# Patient Record
Sex: Female | Born: 1960 | Race: Black or African American | Hispanic: No | Marital: Single | Smoking: Never smoker
Health system: Southern US, Community
[De-identification: ages and names within clinical notes are randomized; demographics above are authoritative.]

## PROBLEM LIST (undated history)

## (undated) DIAGNOSIS — F419 Anxiety disorder, unspecified: Secondary | ICD-10-CM

## (undated) DIAGNOSIS — M1990 Unspecified osteoarthritis, unspecified site: Secondary | ICD-10-CM

## (undated) DIAGNOSIS — F329 Major depressive disorder, single episode, unspecified: Secondary | ICD-10-CM

## (undated) DIAGNOSIS — T148XXA Other injury of unspecified body region, initial encounter: Secondary | ICD-10-CM

## (undated) DIAGNOSIS — H269 Unspecified cataract: Secondary | ICD-10-CM

## (undated) DIAGNOSIS — D219 Benign neoplasm of connective and other soft tissue, unspecified: Secondary | ICD-10-CM

## (undated) DIAGNOSIS — M349 Systemic sclerosis, unspecified: Secondary | ICD-10-CM

## (undated) DIAGNOSIS — F32A Depression, unspecified: Secondary | ICD-10-CM

## (undated) DIAGNOSIS — G4730 Sleep apnea, unspecified: Secondary | ICD-10-CM

## (undated) DIAGNOSIS — G629 Polyneuropathy, unspecified: Secondary | ICD-10-CM

## (undated) DIAGNOSIS — M797 Fibromyalgia: Secondary | ICD-10-CM

## (undated) DIAGNOSIS — E7800 Pure hypercholesterolemia, unspecified: Secondary | ICD-10-CM

## (undated) DIAGNOSIS — I10 Essential (primary) hypertension: Secondary | ICD-10-CM

## (undated) HISTORY — DX: Systemic sclerosis, unspecified: M34.9

## (undated) HISTORY — DX: Sleep apnea, unspecified: G47.30

## (undated) HISTORY — DX: Other injury of unspecified body region, initial encounter: T14.8XXA

## (undated) HISTORY — DX: Benign neoplasm of connective and other soft tissue, unspecified: D21.9

## (undated) HISTORY — DX: Unspecified cataract: H26.9

## (undated) HISTORY — PX: EYE SURGERY: SHX253

## (undated) HISTORY — PX: MYOMECTOMY: SHX85

---

## 1995-10-10 HISTORY — PX: ABDOMINAL SURGERY: SHX537

## 1999-08-05 ENCOUNTER — Other Ambulatory Visit: Admission: RE | Admit: 1999-08-05 | Discharge: 1999-08-05 | Payer: Self-pay | Admitting: Obstetrics & Gynecology

## 2000-08-13 ENCOUNTER — Other Ambulatory Visit: Admission: RE | Admit: 2000-08-13 | Discharge: 2000-08-13 | Payer: Self-pay | Admitting: Obstetrics & Gynecology

## 2001-03-29 ENCOUNTER — Emergency Department (HOSPITAL_COMMUNITY): Admission: EM | Admit: 2001-03-29 | Discharge: 2001-03-29 | Payer: Self-pay | Admitting: Emergency Medicine

## 2001-05-06 ENCOUNTER — Encounter: Payer: Self-pay | Admitting: *Deleted

## 2001-05-06 ENCOUNTER — Ambulatory Visit (HOSPITAL_COMMUNITY): Admission: RE | Admit: 2001-05-06 | Discharge: 2001-05-06 | Payer: Self-pay | Admitting: *Deleted

## 2001-05-30 ENCOUNTER — Encounter: Payer: Self-pay | Admitting: *Deleted

## 2001-05-30 ENCOUNTER — Ambulatory Visit (HOSPITAL_COMMUNITY): Admission: RE | Admit: 2001-05-30 | Discharge: 2001-05-30 | Payer: Self-pay | Admitting: *Deleted

## 2001-09-04 ENCOUNTER — Other Ambulatory Visit: Admission: RE | Admit: 2001-09-04 | Discharge: 2001-09-04 | Payer: Self-pay | Admitting: Obstetrics & Gynecology

## 2002-01-20 ENCOUNTER — Encounter: Payer: Self-pay | Admitting: Emergency Medicine

## 2002-01-20 ENCOUNTER — Inpatient Hospital Stay (HOSPITAL_COMMUNITY): Admission: AC | Admit: 2002-01-20 | Discharge: 2002-01-29 | Payer: Self-pay

## 2002-01-21 ENCOUNTER — Encounter: Payer: Self-pay | Admitting: General Surgery

## 2002-01-23 ENCOUNTER — Encounter: Payer: Self-pay | Admitting: General Surgery

## 2002-05-13 ENCOUNTER — Encounter: Admission: RE | Admit: 2002-05-13 | Discharge: 2002-07-02 | Payer: Self-pay | Admitting: Internal Medicine

## 2003-06-22 ENCOUNTER — Emergency Department (HOSPITAL_COMMUNITY): Admission: EM | Admit: 2003-06-22 | Discharge: 2003-06-22 | Payer: Self-pay | Admitting: Emergency Medicine

## 2003-06-22 ENCOUNTER — Encounter: Payer: Self-pay | Admitting: Emergency Medicine

## 2004-03-02 ENCOUNTER — Ambulatory Visit (HOSPITAL_COMMUNITY): Admission: RE | Admit: 2004-03-02 | Discharge: 2004-03-02 | Payer: Self-pay | Admitting: Internal Medicine

## 2004-03-17 ENCOUNTER — Ambulatory Visit (HOSPITAL_COMMUNITY): Admission: RE | Admit: 2004-03-17 | Discharge: 2004-03-17 | Payer: Self-pay | Admitting: Internal Medicine

## 2004-08-02 ENCOUNTER — Ambulatory Visit (HOSPITAL_COMMUNITY): Admission: RE | Admit: 2004-08-02 | Discharge: 2004-08-02 | Payer: Self-pay

## 2004-08-05 ENCOUNTER — Ambulatory Visit (HOSPITAL_COMMUNITY): Admission: RE | Admit: 2004-08-05 | Discharge: 2004-08-05 | Payer: Self-pay

## 2005-05-08 ENCOUNTER — Ambulatory Visit (HOSPITAL_COMMUNITY): Admission: RE | Admit: 2005-05-08 | Discharge: 2005-05-08 | Payer: Self-pay | Admitting: Family Medicine

## 2005-05-08 ENCOUNTER — Emergency Department (HOSPITAL_COMMUNITY): Admission: EM | Admit: 2005-05-08 | Discharge: 2005-05-08 | Payer: Self-pay | Admitting: Family Medicine

## 2005-07-04 ENCOUNTER — Emergency Department (HOSPITAL_COMMUNITY): Admission: EM | Admit: 2005-07-04 | Discharge: 2005-07-04 | Payer: Self-pay | Admitting: Family Medicine

## 2005-12-16 ENCOUNTER — Emergency Department (HOSPITAL_COMMUNITY): Admission: EM | Admit: 2005-12-16 | Discharge: 2005-12-16 | Payer: Self-pay | Admitting: Family Medicine

## 2007-11-07 ENCOUNTER — Ambulatory Visit (HOSPITAL_COMMUNITY): Admission: RE | Admit: 2007-11-07 | Discharge: 2007-11-07 | Payer: Self-pay | Admitting: Internal Medicine

## 2007-11-13 ENCOUNTER — Ambulatory Visit (HOSPITAL_COMMUNITY): Admission: RE | Admit: 2007-11-13 | Discharge: 2007-11-13 | Payer: Self-pay | Admitting: Internal Medicine

## 2009-02-11 ENCOUNTER — Ambulatory Visit (HOSPITAL_COMMUNITY): Admission: RE | Admit: 2009-02-11 | Discharge: 2009-02-11 | Payer: Self-pay | Admitting: Internal Medicine

## 2012-05-06 ENCOUNTER — Other Ambulatory Visit (HOSPITAL_COMMUNITY): Payer: Self-pay | Admitting: Nurse Practitioner

## 2012-05-06 DIAGNOSIS — Z139 Encounter for screening, unspecified: Secondary | ICD-10-CM

## 2012-05-09 ENCOUNTER — Ambulatory Visit (HOSPITAL_COMMUNITY)
Admission: RE | Admit: 2012-05-09 | Discharge: 2012-05-09 | Disposition: A | Discharge status: Home / Self Care | Payer: Self-pay | Source: Ambulatory Visit | Attending: Family Medicine | Admitting: Family Medicine

## 2012-05-09 DIAGNOSIS — Z139 Encounter for screening, unspecified: Secondary | ICD-10-CM

## 2014-07-08 ENCOUNTER — Inpatient Hospital Stay (HOSPITAL_COMMUNITY)
Admission: EM | Admit: 2014-07-08 | Discharge: 2014-07-10 | DRG: 444 | Disposition: A | Discharge status: Home / Self Care | Payer: BC Managed Care – PPO | Attending: Family Medicine | Admitting: Family Medicine

## 2014-07-08 ENCOUNTER — Encounter (HOSPITAL_COMMUNITY): Payer: Self-pay | Admitting: Emergency Medicine

## 2014-07-08 ENCOUNTER — Emergency Department (HOSPITAL_COMMUNITY): Payer: BC Managed Care – PPO

## 2014-07-08 DIAGNOSIS — R1011 Right upper quadrant pain: Secondary | ICD-10-CM | POA: Diagnosis not present

## 2014-07-08 DIAGNOSIS — K810 Acute cholecystitis: Secondary | ICD-10-CM | POA: Diagnosis not present

## 2014-07-08 DIAGNOSIS — Z823 Family history of stroke: Secondary | ICD-10-CM

## 2014-07-08 DIAGNOSIS — E785 Hyperlipidemia, unspecified: Secondary | ICD-10-CM | POA: Diagnosis present

## 2014-07-08 DIAGNOSIS — G8929 Other chronic pain: Secondary | ICD-10-CM | POA: Diagnosis present

## 2014-07-08 DIAGNOSIS — Z800 Family history of malignant neoplasm of digestive organs: Secondary | ICD-10-CM

## 2014-07-08 DIAGNOSIS — I10 Essential (primary) hypertension: Secondary | ICD-10-CM

## 2014-07-08 DIAGNOSIS — F329 Major depressive disorder, single episode, unspecified: Secondary | ICD-10-CM

## 2014-07-08 DIAGNOSIS — K851 Biliary acute pancreatitis without necrosis or infection: Secondary | ICD-10-CM

## 2014-07-08 DIAGNOSIS — F32A Depression, unspecified: Secondary | ICD-10-CM

## 2014-07-08 DIAGNOSIS — M1990 Unspecified osteoarthritis, unspecified site: Secondary | ICD-10-CM | POA: Diagnosis present

## 2014-07-08 DIAGNOSIS — Z8249 Family history of ischemic heart disease and other diseases of the circulatory system: Secondary | ICD-10-CM

## 2014-07-08 DIAGNOSIS — M797 Fibromyalgia: Secondary | ICD-10-CM | POA: Diagnosis present

## 2014-07-08 DIAGNOSIS — M792 Neuralgia and neuritis, unspecified: Secondary | ICD-10-CM

## 2014-07-08 DIAGNOSIS — Z8271 Family history of polycystic kidney: Secondary | ICD-10-CM

## 2014-07-08 DIAGNOSIS — K5900 Constipation, unspecified: Secondary | ICD-10-CM | POA: Diagnosis present

## 2014-07-08 HISTORY — DX: Fibromyalgia: M79.7

## 2014-07-08 HISTORY — DX: Unspecified osteoarthritis, unspecified site: M19.90

## 2014-07-08 HISTORY — DX: Polyneuropathy, unspecified: G62.9

## 2014-07-08 HISTORY — DX: Essential (primary) hypertension: I10

## 2014-07-08 LAB — COMPREHENSIVE METABOLIC PANEL
ALT: 185 U/L — ABNORMAL HIGH (ref 0–35)
AST: 372 U/L — ABNORMAL HIGH (ref 0–37)
Albumin: 4.1 g/dL (ref 3.5–5.2)
Alkaline Phosphatase: 121 U/L — ABNORMAL HIGH (ref 39–117)
Anion gap: 12 (ref 5–15)
BUN: 14 mg/dL (ref 6–23)
CO2: 26 mEq/L (ref 19–32)
Calcium: 9.5 mg/dL (ref 8.4–10.5)
Chloride: 102 mEq/L (ref 96–112)
Creatinine, Ser: 0.78 mg/dL (ref 0.50–1.10)
GFR calc Af Amer: >90 mL/min (ref >90)
GFR calc non Af Amer: >90 mL/min (ref >90)
Glucose, Bld: 103 mg/dL — ABNORMAL HIGH (ref 70–99)
Potassium: 3.7 mEq/L (ref 3.7–5.3)
Sodium: 140 mEq/L (ref 137–147)
Total Bilirubin: 0.6 mg/dL (ref 0.3–1.2)
Total Protein: 8.1 g/dL (ref 6.0–8.3)

## 2014-07-08 LAB — URINALYSIS, ROUTINE W REFLEX MICROSCOPIC
Bilirubin Urine: NEGATIVE
Glucose, UA: NEGATIVE mg/dL
Hgb urine dipstick: NEGATIVE
Ketones, ur: NEGATIVE mg/dL
Leukocytes, UA: NEGATIVE
Nitrite: NEGATIVE
Protein, ur: NEGATIVE mg/dL
Specific Gravity, Urine: 1.01 (ref 1.005–1.030)
Urobilinogen, UA: 1 mg/dL (ref 0.0–1.0)
pH: 8.5 — ABNORMAL HIGH (ref 5.0–8.0)

## 2014-07-08 LAB — CBC WITH DIFFERENTIAL/PLATELET
Basophils Absolute: 0 10*3/uL (ref 0.0–0.1)
Basophils Relative: 0 % (ref 0–1)
Eosinophils Absolute: 0 10*3/uL (ref 0.0–0.7)
Eosinophils Relative: 0 % (ref 0–5)
HCT: 37.5 % (ref 36.0–46.0)
Hemoglobin: 12.8 g/dL (ref 12.0–15.0)
Lymphocytes Relative: 7 % — ABNORMAL LOW (ref 12–46)
Lymphs Abs: 1 10*3/uL (ref 0.7–4.0)
MCH: 32 pg (ref 26.0–34.0)
MCHC: 34.1 g/dL (ref 30.0–36.0)
MCV: 93.8 fL (ref 78.0–100.0)
Monocytes Absolute: 0.9 10*3/uL (ref 0.1–1.0)
Monocytes Relative: 6 % (ref 3–12)
Neutro Abs: 13.3 10*3/uL — ABNORMAL HIGH (ref 1.7–7.7)
Neutrophils Relative %: 87 % — ABNORMAL HIGH (ref 43–77)
Platelets: 270 10*3/uL (ref 150–400)
RBC: 4 MIL/uL (ref 3.87–5.11)
RDW: 12.9 % (ref 11.5–15.5)
WBC: 15.1 10*3/uL — ABNORMAL HIGH (ref 4.0–10.5)

## 2014-07-08 LAB — TROPONIN I

## 2014-07-08 MED ORDER — ONDANSETRON HCL 4 MG/2ML IJ SOLN
4.0000 mg | Freq: Once | INTRAMUSCULAR | Status: AC
  Administered 2014-07-08: 4 mg via INTRAVENOUS
  Filled 2014-07-08: qty 2

## 2014-07-08 MED ORDER — MORPHINE SULFATE 4 MG/ML IJ SOLN
4.0000 mg | Freq: Once | INTRAMUSCULAR | Status: AC
  Administered 2014-07-08: 4 mg via INTRAVENOUS
  Filled 2014-07-08: qty 1

## 2014-07-08 MED ORDER — SODIUM CHLORIDE 0.9 % IV BOLUS (SEPSIS)
1000.0000 mL | Freq: Once | INTRAVENOUS | Status: AC
  Administered 2014-07-08: 1000 mL via INTRAVENOUS

## 2014-07-08 NOTE — ED Notes (Signed)
Dr. Ward at bedside.

## 2014-07-08 NOTE — ED Provider Notes (Signed)
This chart was scribed for [place], DO by Lowella Petties, ED Scribe. The patient was seen in room APA12/APA12. Patient's care was started at 10:52 PM.  CHIEF COMPLAINT: Abdominal Pain  HPI:  Kristine Young is a 53 y.o. female with a history of HTN who presents to the Emergency Department complaining of sharp, epigastric and right upper quadrant abdominal pain that began earlier this evening. She states that this pain is exacerbated by exhaling but also got worse after eating a hot dog with chili and slaw tonight. She reports associated nausea, and four episodes of emesis since arriving here today. She reports that she feels better currently, but not completely normal. She also reports loose stool. She denies hematochezia or melena. She denies chest pain or SOB. Denies a history of prior peptic ulcer disease. No history of endoscopy. No history of alcohol use. She is on diclofenac when necessary but no other NSAIDs. Denies a history of cholecystectomy. No prior history of pancreatitis.  ROS: See HPI Constitutional: no fever  Eyes: no drainage  ENT: nasal congestion Cardiovascular:  no chest pain  Resp: cough no SOB GI: nausea, vomiting, and abdominal pain GU: no dysuria Integumentary: no rash  Allergy: no hives  Musculoskeletal: no leg swelling  Neurological: no slurred speech ROS otherwise negative  PAST MEDICAL HISTORY/PAST SURGICAL HISTORY:  Past Medical History  Diagnosis Date  . Hypertension   . Arthritis   . Fibromyalgia   . Neuropathy     MEDICATIONS:  Prior to Admission medications   Medication Sig Start Date End Date Taking? Authorizing Provider  pregabalin (LYRICA) 25 MG capsule Take 25 mg by mouth 2 (two) times daily.   Yes Historical Provider, MD    ALLERGIES:  Allergies  Allergen Reactions  . Tetracyclines & Related     SOCIAL HISTORY:  History  Substance Use Topics  . Smoking status: Never Smoker   . Smokeless tobacco: Not on file  . Alcohol Use:  No    FAMILY HISTORY: History reviewed. No pertinent family history.  EXAM: Triage Vitals: BP 134/67  Pulse 81  Temp(Src) 98.1 F (36.7 C) (Oral)  Resp 16  Ht 5\' 3"  (1.6 m)  Wt 186 lb (84.369 kg)  BMI 32.96 kg/m2  SpO2 97%  CONSTITUTIONAL: Alert and oriented and responds appropriately to questions. Well-appearing; well-nourished HEAD: Normocephalic EYES: Conjunctivae clear, PERRL ENT: normal nose; no rhinorrhea; moist mucous membranes; pharynx without lesions noted NECK: Supple, no meningismus, no LAD  CARD: RRR; S1 and S2 appreciated; no murmurs, no clicks, no rubs, no gallops RESP: Normal chest excursion without splinting or tachypnea; breath sounds clear and equal bilaterally; no wheezes, no rhonchi, no rales, no hypoxia rest of her distress, speaking full sentences ABD/GI: Normal bowel sounds; non-distended; soft, tender to palpation in epigastric region with a positive Murphy's sign, no rebound, no guarding, no tenderness at McBurney's point, no peritoneal signs BACK:  The back appears normal and is non-tender to palpation, there is no CVA tenderness EXT: Normal ROM in all joints; non-tender to palpation; no edema; normal capillary refill; no cyanosis    SKIN: Normal color for age and race; warm NEURO: Moves all extremities equally PSYCH: The patient's mood and manner are appropriate. Grooming and personal hygiene are appropriate.  MEDICAL DECISION MAKING: Patient here with right upper quadrant epigastric pain. She has a positive Murphy sign. Concern for possible cholecystitis, cholelithiasis, pancreatitis, gastritis, peptic ulcer, GERD. We'll obtain labs, urine and a CT of her abdomen and  pelvis as you're unable to obtain ultrasound at this time. We'll give pain and nausea medicine and reassess.  ED PROGRESS: Patient has a leukocytosis of 15 with left shift. Her AST, ALT, alkaline phosphatase are elevated. Her lipase is greater than 2000. Urine shows no sign of infection.  Bilirubin is normal. EKG shows no ischemic changes. Troponin negative. Discussed with Dr. Stark Jock who will followup on patient's CT scan.      EKG Interpretation  Date/Time:  Wednesday July 08 2014 23:25:41 EDT Ventricular Rate:  85 PR Interval:  121 QRS Duration: 98 QT Interval:  384 QTC Calculation: 457 R Axis:   60 Text Interpretation:  Sinus rhythm Minimal ST depression, inferior leads No reciprocal changes No old tracing to compare Confirmed by WARD,  DO, KRISTEN ([id number]) on 07/08/2014 11:33:30 PM         I personally performed the services described in this documentation, which was scribed in my presence. The recorded information has been reviewed and is accurate.   [place], DO 07/09/14 0028

## 2014-07-08 NOTE — ED Notes (Signed)
Patient vomited x 1 in restroom. No acute distress noted at this time. Delay explained to patient and patient verbalized understanding.

## 2014-07-08 NOTE — ED Notes (Signed)
Pt abd pain that started this evening, states been having a cold, denies N/V/D

## 2014-07-09 ENCOUNTER — Inpatient Hospital Stay (HOSPITAL_COMMUNITY): Payer: BC Managed Care – PPO

## 2014-07-09 ENCOUNTER — Encounter (HOSPITAL_COMMUNITY): Payer: Self-pay | Admitting: *Deleted

## 2014-07-09 DIAGNOSIS — F329 Major depressive disorder, single episode, unspecified: Secondary | ICD-10-CM | POA: Diagnosis present

## 2014-07-09 DIAGNOSIS — M1990 Unspecified osteoarthritis, unspecified site: Secondary | ICD-10-CM | POA: Diagnosis present

## 2014-07-09 DIAGNOSIS — Z823 Family history of stroke: Secondary | ICD-10-CM | POA: Diagnosis not present

## 2014-07-09 DIAGNOSIS — I10 Essential (primary) hypertension: Secondary | ICD-10-CM

## 2014-07-09 DIAGNOSIS — E785 Hyperlipidemia, unspecified: Secondary | ICD-10-CM | POA: Diagnosis present

## 2014-07-09 DIAGNOSIS — Z8249 Family history of ischemic heart disease and other diseases of the circulatory system: Secondary | ICD-10-CM | POA: Diagnosis not present

## 2014-07-09 DIAGNOSIS — Z8271 Family history of polycystic kidney: Secondary | ICD-10-CM | POA: Diagnosis not present

## 2014-07-09 DIAGNOSIS — K851 Biliary acute pancreatitis: Secondary | ICD-10-CM | POA: Diagnosis present

## 2014-07-09 DIAGNOSIS — Z800 Family history of malignant neoplasm of digestive organs: Secondary | ICD-10-CM | POA: Diagnosis not present

## 2014-07-09 DIAGNOSIS — M797 Fibromyalgia: Secondary | ICD-10-CM | POA: Diagnosis present

## 2014-07-09 DIAGNOSIS — K5900 Constipation, unspecified: Secondary | ICD-10-CM | POA: Diagnosis present

## 2014-07-09 DIAGNOSIS — M792 Neuralgia and neuritis, unspecified: Secondary | ICD-10-CM

## 2014-07-09 DIAGNOSIS — R1011 Right upper quadrant pain: Secondary | ICD-10-CM | POA: Diagnosis present

## 2014-07-09 DIAGNOSIS — K810 Acute cholecystitis: Secondary | ICD-10-CM | POA: Diagnosis present

## 2014-07-09 DIAGNOSIS — G8929 Other chronic pain: Secondary | ICD-10-CM | POA: Diagnosis present

## 2014-07-09 LAB — COMPREHENSIVE METABOLIC PANEL
ALK PHOS: 126 U/L — AB (ref 39–117)
ALT: 250 U/L — AB (ref 0–35)
AST: 311 U/L — AB (ref 0–37)
Albumin: 3.7 g/dL (ref 3.5–5.2)
Anion gap: 10 (ref 5–15)
BILIRUBIN TOTAL: 0.7 mg/dL (ref 0.3–1.2)
BUN: 9 mg/dL (ref 6–23)
CO2: 27 meq/L (ref 19–32)
CREATININE: 0.66 mg/dL (ref 0.50–1.10)
Calcium: 8.9 mg/dL (ref 8.4–10.5)
Chloride: 106 mEq/L (ref 96–112)
GFR calc Af Amer: >90 mL/min (ref >90)
Glucose, Bld: 96 mg/dL (ref 70–99)
POTASSIUM: 3.9 meq/L (ref 3.7–5.3)
Sodium: 143 mEq/L (ref 137–147)
Total Protein: 7.4 g/dL (ref 6.0–8.3)

## 2014-07-09 LAB — CBC
HCT: 36.9 % (ref 36.0–46.0)
Hemoglobin: 12.6 g/dL (ref 12.0–15.0)
MCH: 32.1 pg (ref 26.0–34.0)
MCHC: 34.1 g/dL (ref 30.0–36.0)
MCV: 94.1 fL (ref 78.0–100.0)
PLATELETS: 263 10*3/uL (ref 150–400)
RBC: 3.92 MIL/uL (ref 3.87–5.11)
RDW: 13.3 % (ref 11.5–15.5)
WBC: 10.3 10*3/uL (ref 4.0–10.5)

## 2014-07-09 LAB — APTT: aPTT: 31 seconds (ref 24–37)

## 2014-07-09 LAB — PROTIME-INR
INR: 1.16 (ref 0.00–1.49)
PROTHROMBIN TIME: 14.8 s (ref 11.6–15.2)

## 2014-07-09 LAB — MRSA PCR SCREENING: MRSA BY PCR: NEGATIVE

## 2014-07-09 LAB — LIPASE, BLOOD: Lipase: 2330 U/L — ABNORMAL HIGH (ref 11–59)

## 2014-07-09 MED ORDER — ONDANSETRON HCL 4 MG/2ML IJ SOLN: 4.0000 mg | Freq: Three times a day (TID) | INTRAMUSCULAR | Status: AC | PRN

## 2014-07-09 MED ORDER — LISINOPRIL 10 MG PO TABS
10.0000 mg | ORAL_TABLET | Freq: Every day | ORAL | Status: DC
  Administered 2014-07-09 – 2014-07-10 (×2): 10 mg via ORAL
  Filled 2014-07-09 (×2): qty 1

## 2014-07-09 MED ORDER — PANTOPRAZOLE SODIUM 40 MG PO TBEC
40.0000 mg | DELAYED_RELEASE_TABLET | Freq: Two times a day (BID) | ORAL | Status: DC
  Administered 2014-07-09 – 2014-07-10 (×2): 40 mg via ORAL
  Filled 2014-07-09 (×2): qty 1

## 2014-07-09 MED ORDER — CHLORHEXIDINE GLUCONATE 0.12 % MT SOLN
15.0000 mL | Freq: Two times a day (BID) | OROMUCOSAL | Status: DC
  Administered 2014-07-09 – 2014-07-10 (×2): 15 mL via OROMUCOSAL
  Filled 2014-07-09 (×2): qty 15

## 2014-07-09 MED ORDER — IOHEXOL 300 MG/ML  SOLN
50.0000 mL | Freq: Once | INTRAMUSCULAR | Status: AC | PRN
  Administered 2014-07-09: 50 mL via ORAL

## 2014-07-09 MED ORDER — MORPHINE SULFATE 4 MG/ML IJ SOLN
4.0000 mg | INTRAMUSCULAR | Status: DC | PRN
  Administered 2014-07-09: 4 mg via INTRAVENOUS
  Filled 2014-07-09: qty 1

## 2014-07-09 MED ORDER — CETYLPYRIDINIUM CHLORIDE 0.05 % MT LIQD: 7.0000 mL | Freq: Two times a day (BID) | OROMUCOSAL | Status: DC

## 2014-07-09 MED ORDER — POLYETHYLENE GLYCOL 3350 17 G PO PACK: 17.0000 g | PACK | Freq: Every day | ORAL | Status: DC | PRN

## 2014-07-09 MED ORDER — PREGABALIN 25 MG PO CAPS
25.0000 mg | ORAL_CAPSULE | Freq: Two times a day (BID) | ORAL | Status: DC
  Administered 2014-07-09 – 2014-07-10 (×3): 25 mg via ORAL
  Filled 2014-07-09 (×3): qty 1

## 2014-07-09 MED ORDER — HYDROCHLOROTHIAZIDE 12.5 MG PO CAPS
12.5000 mg | ORAL_CAPSULE | Freq: Every day | ORAL | Status: DC
  Administered 2014-07-09 – 2014-07-10 (×2): 12.5 mg via ORAL
  Filled 2014-07-09 (×2): qty 1

## 2014-07-09 MED ORDER — ONDANSETRON HCL 4 MG/2ML IJ SOLN
4.0000 mg | Freq: Four times a day (QID) | INTRAMUSCULAR | Status: DC | PRN
  Filled 2014-07-09: qty 2

## 2014-07-09 MED ORDER — ONDANSETRON HCL 4 MG/2ML IJ SOLN
4.0000 mg | Freq: Three times a day (TID) | INTRAMUSCULAR | Status: DC
  Administered 2014-07-09 (×2): 4 mg via INTRAVENOUS
  Filled 2014-07-09: qty 2

## 2014-07-09 MED ORDER — ATORVASTATIN CALCIUM 20 MG PO TABS
20.0000 mg | ORAL_TABLET | Freq: Every day | ORAL | Status: DC
  Administered 2014-07-09: 20 mg via ORAL
  Filled 2014-07-09: qty 1

## 2014-07-09 MED ORDER — SODIUM CHLORIDE 0.45 % IV SOLN
INTRAVENOUS | Status: DC
  Administered 2014-07-09: 15:00:00 via INTRAVENOUS
  Administered 2014-07-09: 1000 mL via INTRAVENOUS
  Administered 2014-07-10: 01:00:00 via INTRAVENOUS

## 2014-07-09 MED ORDER — IOHEXOL 300 MG/ML  SOLN
100.0000 mL | Freq: Once | INTRAMUSCULAR | Status: AC | PRN
  Administered 2014-07-09: 100 mL via INTRAVENOUS

## 2014-07-09 MED ORDER — FLUOXETINE HCL 20 MG PO CAPS
20.0000 mg | ORAL_CAPSULE | Freq: Every day | ORAL | Status: DC
  Administered 2014-07-09: 20 mg via ORAL
  Filled 2014-07-09: qty 1

## 2014-07-09 MED ORDER — HEPARIN SODIUM (PORCINE) 5000 UNIT/ML IJ SOLN
5000.0000 [IU] | Freq: Three times a day (TID) | INTRAMUSCULAR | Status: DC
  Administered 2014-07-09 – 2014-07-10 (×5): 5000 [IU] via SUBCUTANEOUS
  Filled 2014-07-09 (×5): qty 1

## 2014-07-09 MED ORDER — ONDANSETRON HCL 4 MG PO TABS: 4.0000 mg | ORAL_TABLET | Freq: Four times a day (QID) | ORAL | Status: DC | PRN

## 2014-07-09 MED ORDER — HYDROMORPHONE HCL 1 MG/ML IJ SOLN: 1.0000 mg | INTRAMUSCULAR | Status: AC | PRN

## 2014-07-09 MED ORDER — GADOBENATE DIMEGLUMINE 529 MG/ML IV SOLN
15.0000 mL | Freq: Once | INTRAVENOUS | Status: AC | PRN
  Administered 2014-07-09: 15 mL via INTRAVENOUS

## 2014-07-09 MED ORDER — SODIUM CHLORIDE 0.9 % IV SOLN
3.0000 g | Freq: Once | INTRAVENOUS | Status: AC
  Administered 2014-07-09: 3 g via INTRAVENOUS
  Filled 2014-07-09: qty 3

## 2014-07-09 MED ORDER — SENNA 8.6 MG PO TABS
1.0000 | ORAL_TABLET | Freq: Two times a day (BID) | ORAL | Status: DC
  Administered 2014-07-09 – 2014-07-10 (×3): 8.6 mg via ORAL
  Filled 2014-07-09 (×3): qty 1

## 2014-07-09 MED ORDER — SODIUM CHLORIDE 0.9 % IV SOLN
INTRAVENOUS | Status: AC
  Administered 2014-07-09: 02:00:00 via INTRAVENOUS

## 2014-07-09 NOTE — Plan of Care (Signed)
Problem: Consults Goal: General Medical Patient Education See Patient Education Module for specific education. Outcome: Progressing Wall thickening on the gallbladder, elevated lipase >than 2000.  LFT"s elevated, discussing need for gallbladder removal  Problem: Phase I Progression Outcomes Goal: Initial discharge plan identified Outcome: Completed/Met Date Met:  07/09/14 Home with sister Hilda Blades

## 2014-07-09 NOTE — ED Notes (Signed)
Dr. Delo at bedside. 

## 2014-07-09 NOTE — H&P (Signed)
[facility] History and Physical  Kristine Young OZH:[id number] DOB: 1961/09/26 DOA: 07/08/2014  Referring physician: Dr Stark Jock PCP: Monico Blitz, MD  Specialists: GI/Gen Surg  Chief Complaint: RUQ pain  Assessment/Plan Active Problems:   Right upper quadrant pain Cholecystitis Pancreatitis HTN Neuropathic pain Depression HLD Leukocytosis  Cholecystitis: CT and labs suggestive of cholecystitis. Dr Gala Romney of GI consulted by ED adn will see pt in the am - Admit to [facility] 2 - NPO - Morphine 4mg  Q4prn - Abd [place] - 1/2NS 140ml/hr - CMET in am - Zofran PRN - consider surgical consult in am for likely cholecystectomy - coags  Pancreatitis: likely secondary to gall stones though none seen on CT.  - workup as above  Leukocytosis: likely secondary to cholecystitis and pancreatitis. No obvious sign of infection. Afebrile - CBC in am  HTN: normotensive on admission - continue home lisinopril and HCTZ (consider changing to IV lopressor if unable to tolerate orals)  Depression: well controlled per pt.  - continue home FLuoxetine   Chronic pain: primarily neuropathic.  - lyrica   DVT Prophylaxis: Heparin [place] TID  Code Status: FULL Family Communication: sister present for admission Disposition Plan: pending improvement and possible surgical intervention  HPI: Kristine Young is a 53 y.o. female came to [facility] ed 07/09/2014 with  Acute onset RUQ pain. Started this evening after eating a chili dog. Associated w/ emesis. Occurred around 6:45. Small amount of abd pain for the past week. Pain is constant and getting worse. Hurt to breath. Deneis diarrhea, fever, CP, SOB, syncope, fever.   Review of Systems: Per HPI w/ all other systems negative.   Past Medical History  Diagnosis Date  . Hypertension   . Arthritis   . Fibromyalgia   . Neuropathy    Past Surgical History  Procedure Laterality Date  . Eye surgery     Social History:  History   Social History Narrative   . No narrative on file    Allergies  Allergen Reactions  . Tetracyclines & Related     History reviewed. No pertinent family history.   Prior to Admission medications   Medication Sig Start Date End Date Taking? Authorizing Provider  pregabalin (LYRICA) 25 MG capsule Take 25 mg by mouth 2 (two) times daily.   Yes Historical Provider, MD   Physical Exam: Filed Vitals:   07/08/14 2043 07/08/14 2239  BP: 132/82 134/67  Pulse: 85 81  Temp: 98.1 F (36.7 C)   TempSrc: Oral   Resp: 20 16  Height: 5\' 3"  (1.6 m)   Weight: 84.369 kg (186 lb)   SpO2: 100% 97%     General:  NAD  Eyes: PERRL EOMI   ENT: mmm,  Neck: FROM  Cardiovascular: RRR, no m/r/g  Respiratory: Nml WOB. No wheezes, ronchi. Good breath sounds throughout  Abdomen: RUQ and epigastric tenderness to palpation. No rebound. No pain at McBurny's point. Diminished BS  Skin: warm, well perfused, intact  Musculoskeletal: No LE edema, moves all extremities spontaneously   Psychiatric: nml affect  Neurologic: CN2-12 Grossly intact, cerebellar fxn nml  Labs on Admission:  Basic Metabolic Panel:  Recent Labs Lab 07/08/14 2124  NA 140  K 3.7  CL 102  CO2 26  GLUCOSE 103*  BUN 14  CREATININE 0.78  CALCIUM 9.5   Liver Function Tests:  Recent Labs Lab 07/08/14 2124  AST 372*  ALT 185*  ALKPHOS 121*  BILITOT 0.6  PROT 8.1  ALBUMIN 4.1    Recent  Labs Lab 07/08/14 2257  LIPASE 2330*   No results found for this basename: AMMONIA,  in the last 168 hours CBC:  Recent Labs Lab 07/08/14 2124  WBC 15.1*  NEUTROABS 13.3*  HGB 12.8  HCT 37.5  MCV 93.8  PLT 270   Cardiac Enzymes:  Recent Labs Lab 07/08/14 2257  TROPONINI <0.30    BNP (last 3 results) No results found for this basename: PROBNP,  in the last 8760 hours CBG: No results found for this basename: GLUCAP,  in the last 168 hours  Radiological Exams on Admission: Ct Abdomen Pelvis W Contrast  07/09/2014   CLINICAL  DATA:  Epigastric and right upper quadrant pain, nausea/vomiting, constipation, diarrhea  EXAM: CT ABDOMEN AND PELVIS WITH CONTRAST  TECHNIQUE: Multidetector CT imaging of the abdomen and pelvis was performed using the standard protocol following bolus administration of intravenous contrast.  CONTRAST:  138mL OMNIPAQUE IOHEXOL 300 MG/ML  SOLN  COMPARISON:  08/17/2008  FINDINGS: Lower chest:  Lung bases are clear.  Hepatobiliary: Liver is within normal limits.  Mild gallbladder wall thickening/edema (series 2/image 36).  No intrahepatic or extrahepatic ductal dilatation.  Pancreas: Within normal limits.  Spleen: Within normal limits.  Adrenals/Urinary Tract: Adrenal glands are unremarkable.  Left renal sinus cysts. Right kidney is within normal limits. No hydronephrosis.  Bladder is within normal limits.  Stomach/Bowel: Stomach is unremarkable.  No evidence of bowel obstruction.  Normal appendix.  Vascular/Lymphatic: No evidence of abdominal aortic aneurysm.  No suspicious abdominopelvic lymphadenopathy.  Reproductive: Uterus is unremarkable.  Bilateral ovaries are within normal limits.  Other: No abdominopelvic ascites.  Musculoskeletal: Mild degenerative changes of the visualized thoracolumbar spine.  IMPRESSION: Mild gallbladder wall thickening/edema, poorly evaluated on CT, but raising the possibility of early acute cholecystitis. Consider right upper quadrant ultrasound for further evaluation.   Electronically Signed   By: Julian Hy M.D.   On: 07/09/2014 00:58       Time spent: 60 min in direct pt care and coordination   Fizza Scales J, MD [facility] [email or web address] Password [email or web address] 07/09/2014, 2:11 AM

## 2014-07-09 NOTE — Progress Notes (Signed)
[facility] PROGRESS NOTE  Kristine Young NFA:[id number] DOB: 15-Sep-1961 DOA: 07/08/2014 PCP: Monico Blitz, MD  Assessment/Plan: Cholecystitis: CT and labs suggestive of cholecystitis. - Dr Gala Romney of GI consulted by ED - Cont NPO status - Abd [place] pending - Cont IVF hydration - Consideration for surgical consult for likely cholecystectomy   Pancreatitis: - Likely gallstone pancreatitis - NPO, supportive care  Leukocytosis:  - likely secondary to cholecystitis and pancreatitis.  - Afebrile  - Follow CBC  HTN:  - normotensive - continue home lisinopril and HCTZ  Depression: well controlled per pt.  - continue home FLuoxetine   Chronic pain: primarily neuropathic.  - Cont lyrica   DVT Prophylaxis:  - Heparin [place] TID  Code Status: Full Family Communication: Pt in room Disposition Plan: Pending   Consultants:  GI  Procedures:    Antibiotics:    HPI/Subjective: Feels slightly better today. No acute events noted overnight  Objective: Filed Vitals:   07/08/14 2043 07/08/14 2239 07/09/14 0225 07/09/14 0255  BP: 132/82 134/67 133/68   Pulse: 85 81 77   Temp: 98.1 F (36.7 C)   98.4 F (36.9 C)  TempSrc: Oral   Oral  Resp: 20 16 16    Height: 5\' 3"  (1.6 m)     Weight: 84.369 kg (186 lb)     SpO2: 100% 97% 99%     Intake/Output Summary (Last 24 hours) at 07/09/14 0756 Last data filed at 07/09/14 0600  Gross per 24 hour  Intake 347.08 ml  Output    425 ml  Net -77.92 ml   Filed Weights   07/08/14 2043  Weight: 84.369 kg (186 lb)    Exam:   General:  Awake, in nad  Cardiovascular: regular, s1, s2  Respiratory: normal resp effort, no wheezing  Abdomen: soft, nondistended, mildly tender  Musculoskeletal: perfused, no clubbing   Data Reviewed: Basic Metabolic Panel:  Recent Labs Lab 07/08/14 2124 07/09/14 0658  NA 140 143  K 3.7 3.9  CL 102 106  CO2 26 27  GLUCOSE 103* 96  BUN 14 9  CREATININE 0.78 0.66  CALCIUM 9.5 8.9    Liver Function Tests:  Recent Labs Lab 07/08/14 2124 07/09/14 0658  AST 372* 311*  ALT 185* 250*  ALKPHOS 121* 126*  BILITOT 0.6 0.7  PROT 8.1 7.4  ALBUMIN 4.1 3.7    Recent Labs Lab 07/08/14 2257  LIPASE 2330*   No results found for this basename: AMMONIA,  in the last 168 hours CBC:  Recent Labs Lab 07/08/14 2124 07/09/14 0658  WBC 15.1* 10.3  NEUTROABS 13.3*  --   HGB 12.8 12.6  HCT 37.5 36.9  MCV 93.8 94.1  PLT 270 263   Cardiac Enzymes:  Recent Labs Lab 07/08/14 2257  TROPONINI <0.30   BNP (last 3 results) No results found for this basename: PROBNP,  in the last 8760 hours CBG: No results found for this basename: GLUCAP,  in the last 168 hours  Recent Results (from the past 240 hour(s))  MRSA PCR SCREENING     Status: None   Collection Time    07/09/14  2:50 AM      Result Value Ref Range Status   MRSA by PCR NEGATIVE  NEGATIVE Final   Comment:            The GeneXpert MRSA Assay (FDA     approved for NASAL specimens     only), is one component of a     comprehensive MRSA  colonization     surveillance program. It is not     intended to diagnose MRSA     infection nor to guide or     monitor treatment for     MRSA infections.     Studies: Ct Abdomen Pelvis W Contrast  07/09/2014   CLINICAL DATA:  Epigastric and right upper quadrant pain, nausea/vomiting, constipation, diarrhea  EXAM: CT ABDOMEN AND PELVIS WITH CONTRAST  TECHNIQUE: Multidetector CT imaging of the abdomen and pelvis was performed using the standard protocol following bolus administration of intravenous contrast.  CONTRAST:  174mL OMNIPAQUE IOHEXOL 300 MG/ML  SOLN  COMPARISON:  08/17/2008  FINDINGS: Lower chest:  Lung bases are clear.  Hepatobiliary: Liver is within normal limits.  Mild gallbladder wall thickening/edema (series 2/image 36).  No intrahepatic or extrahepatic ductal dilatation.  Pancreas: Within normal limits.  Spleen: Within normal limits.  Adrenals/Urinary Tract:  Adrenal glands are unremarkable.  Left renal sinus cysts. Right kidney is within normal limits. No hydronephrosis.  Bladder is within normal limits.  Stomach/Bowel: Stomach is unremarkable.  No evidence of bowel obstruction.  Normal appendix.  Vascular/Lymphatic: No evidence of abdominal aortic aneurysm.  No suspicious abdominopelvic lymphadenopathy.  Reproductive: Uterus is unremarkable.  Bilateral ovaries are within normal limits.  Other: No abdominopelvic ascites.  Musculoskeletal: Mild degenerative changes of the visualized thoracolumbar spine.  IMPRESSION: Mild gallbladder wall thickening/edema, poorly evaluated on CT, but raising the possibility of early acute cholecystitis. Consider right upper quadrant ultrasound for further evaluation.   Electronically Signed   By: Julian Hy M.D.   On: 07/09/2014 00:58    Scheduled Meds: . atorvastatin  20 mg Oral q1800  . FLUoxetine  20 mg Oral QHS  . heparin  5,000 Units Subcutaneous 3 times per day  . hydrochlorothiazide  12.5 mg Oral Daily  . lisinopril  10 mg Oral Daily  . pregabalin  25 mg Oral BID  . senna  1 tablet Oral BID   Continuous Infusions: . sodium chloride 100 mL/hr at 07/09/14 0600    Active Problems:   Right upper quadrant pain  Time spent: 67min  [facility], [place] [facility] Pager [phone]. If 7PM-7AM, please contact night-coverage at [email or web address], password [facility] 07/09/2014, 7:56 AM  LOS: 1 day

## 2014-07-09 NOTE — Consult Note (Signed)
REVIEWED. CONSIDER MRCP.

## 2014-07-09 NOTE — Consult Note (Signed)
Referring Provider: Waldemar Dickens, MD Primary Care Physician:  Monico Blitz, MD Primary Gastroenterologist:  Barney Drain, MD  Reason for Consultation:  Biliary pancreatitis  HPI: Kristine Young is a 53 y.o. female who presented with acute onset ruq/epigastric pain associated with N/V. Symptoms began after eating chili dog last night. Feels better this morning. Similar episodes of abdominal pain before but not this severe. Some heartburn lately. Some constipation, sometimes use suppository. No melena, brbpr. Planning on colonoscopy in near future. Not yet scheduled but PCP has recommended.      Prior to Admission medications   Medication Sig Start Date End Date Taking? Authorizing Provider  pregabalin (LYRICA) 25 MG capsule Take 25 mg by mouth 2 (two) times daily.   Yes Historical Provider, MD  Prozac daily BP medication Diclofenac prn Tramadol prn Claritin Lipitor No OTC NSAIDs/ASA.    Current Facility-Administered Medications  Medication Dose Route Frequency Provider Last Rate Last Dose  . 0.45 % sodium chloride infusion   Intravenous Continuous Waldemar Dickens, MD 100 mL/hr at 07/09/14 0600    . atorvastatin (LIPITOR) tablet 20 mg  20 mg Oral q1800 Waldemar Dickens, MD      . FLUoxetine (PROZAC) capsule 20 mg  20 mg Oral QHS Waldemar Dickens, MD      . heparin injection 5,000 Units  5,000 Units Subcutaneous 3 times per day Waldemar Dickens, MD   5,000 Units at 07/09/14 0608  . hydrochlorothiazide (MICROZIDE) capsule 12.5 mg  12.5 mg Oral Daily Waldemar Dickens, MD      . HYDROmorphone (DILAUDID) injection 1 mg  1 mg Intravenous Q4H PRN Veryl Speak, MD      . lisinopril (PRINIVIL,ZESTRIL) tablet 10 mg  10 mg Oral Daily Waldemar Dickens, MD      . morphine 4 MG/ML injection 4 mg  4 mg Intravenous Q4H PRN Waldemar Dickens, MD      . ondansetron [facility]) injection 4 mg  4 mg Intravenous Q8H PRN Veryl Speak, MD      . ondansetron (ZOFRAN) tablet 4 mg  4 mg Oral Q6H PRN Waldemar Dickens,  MD       Or  . ondansetron [facility]) injection 4 mg  4 mg Intravenous Q6H PRN Waldemar Dickens, MD      . polyethylene glycol (MIRALAX / GLYCOLAX) packet 17 g  17 g Oral Daily PRN Waldemar Dickens, MD      . pregabalin (LYRICA) capsule 25 mg  25 mg Oral BID Waldemar Dickens, MD      . [name] [facility]) tablet 8.6 mg  1 tablet Oral BID Waldemar Dickens, MD        Allergies as of 07/08/2014 - Review Complete 07/08/2014  Allergen Reaction Noted  . Tetracyclines & related  07/08/2014    Past Medical History  Diagnosis Date  . Hypertension   . Arthritis   . Fibromyalgia   . Neuropathy     Past Surgical History  Procedure Laterality Date  . Eye surgery    . Myomectomy      Family History  Problem Relation Age of Onset  . Polycystic kidney disease Mother   . Stroke Mother   . Heart attack Father   . Heart failure Father   . Hypertension Sister   . Hypothyroidism Sister   . Osteoarthritis Sister   . Hyperlipidemia Sister   . Liver disease Neg Hx   . Colon cancer Father  greater than age 35  . Pancreatic disease Neg Hx     History   Social History  . Marital Status: Single    Spouse Name: N/A    Number of Children: 0  . Years of Education: N/A   Occupational History  . CNA - [employer]    Social History Main Topics  . Smoking status: Never Smoker   . Smokeless tobacco: Not on file  . Alcohol Use: No  . Drug Use: No  . Sexual Activity: Not on file   Other Topics Concern  . Not on file   Social History Narrative  . No narrative on file     ROS:  General: Negative for anorexia, weight loss, fever, chills, fatigue, weakness. Eyes: Negative for vision changes.  ENT: Negative for hoarseness, difficulty swallowing , nasal congestion. CV: Negative for chest pain, angina, palpitations, dyspnea on exertion, peripheral edema.  Respiratory: Negative for dyspnea at rest, dyspnea on exertion, cough, sputum, wheezing.  GI: See history of present illness. GU:  Negative for  dysuria, hematuria, urinary incontinence, urinary frequency, nocturnal urination.  MS: Negative for joint pain, low back pain. +neuropathy Derm: Negative for rash or itching.  Neuro: Negative for weakness, abnormal sensation, seizure, frequent headaches, memory loss, confusion.  Psych: Negative for anxiety, depression, suicidal ideation, hallucinations.  Endo: Negative for unusual weight change.  Heme: Negative for bruising or bleeding. Allergy: Negative for rash or hives.       Physical Examination: Vital signs in last 24 hours: Temp:  [98.1 F (36.7 C)-98.4 F (36.9 C)] 98.4 F (36.9 C) (10/01 0255) Pulse Rate:  [77-85] 77 (10/01 0225) Resp:  [16-20] 16 (10/01 0225) BP: (132-134)/(67-82) 133/68 mmHg (10/01 0225) SpO2:  [97 %-100 %] 99 % (10/01 0225) Weight:  [186 lb (84.369 kg)] 186 lb (84.369 kg) (09/30 2043) Last BM Date: 07/08/14  General: Well-nourished, well-developed in no acute distress.  Head: Normocephalic, atraumatic.   Eyes: Conjunctiva pink, no icterus. Mouth: Oropharyngeal mucosa moist and pink , no lesions erythema or exudate. Neck: Supple without thyromegaly, masses, or lymphadenopathy.  Lungs: Clear to auscultation bilaterally.  Heart: Regular rate and rhythm, no murmurs rubs or gallops.  Abdomen: Bowel sounds are normal, mild to moderate epigastric tenderness, nondistended, no hepatosplenomegaly or masses, no abdominal bruits or    hernia , no rebound or guarding.   Rectal: not performed Extremities: No lower extremity edema, clubbing, deformity.  Neuro: Alert and oriented x 4 , grossly normal neurologically.  Skin: Warm and dry, no rash or jaundice.   Psych: Alert and cooperative, normal mood and affect.        Intake/Output from previous day: 09/30 0701 - 10/01 0700 In: 347.1 [I.V.:347.1] Out: 425 [Urine:425] Intake/Output this shift:    Lab Results: CBC  Recent Labs  07/08/14 2124 07/09/14 0658  WBC 15.1* 10.3  HGB 12.8 12.6  HCT 37.5 36.9   MCV 93.8 94.1  PLT 270 263   BMET  Recent Labs  07/08/14 2124 07/09/14 0658  NA 140 143  K 3.7 3.9  CL 102 106  CO2 26 27  GLUCOSE 103* 96  BUN 14 9  CREATININE 0.78 0.66  CALCIUM 9.5 8.9   LFT  Recent Labs  07/08/14 2124 07/09/14 0658  BILITOT 0.6 0.7  ALKPHOS 121* 126*  AST 372* 311*  ALT 185* 250*  PROT 8.1 7.4  ALBUMIN 4.1 3.7    Lipase  Recent Labs  07/08/14 2257  LIPASE 2330*    PT/INR  Recent Labs  07/09/14 0658  LABPROT 14.8  INR 1.16      Imaging Studies: Ct Abdomen Pelvis W Contrast  07/09/2014   CLINICAL DATA:  Epigastric and right upper quadrant pain, nausea/vomiting, constipation, diarrhea  EXAM: CT ABDOMEN AND PELVIS WITH CONTRAST  TECHNIQUE: Multidetector CT imaging of the abdomen and pelvis was performed using the standard protocol following bolus administration of intravenous contrast.  CONTRAST:  151mL OMNIPAQUE IOHEXOL 300 MG/ML  SOLN  COMPARISON:  08/17/2008  FINDINGS: Lower chest:  Lung bases are clear.  Hepatobiliary: Liver is within normal limits.  Mild gallbladder wall thickening/edema (series 2/image 36).  No intrahepatic or extrahepatic ductal dilatation.  Pancreas: Within normal limits.  Spleen: Within normal limits.  Adrenals/Urinary Tract: Adrenal glands are unremarkable.  Left renal sinus cysts. Right kidney is within normal limits. No hydronephrosis.  Bladder is within normal limits.  Stomach/Bowel: Stomach is unremarkable.  No evidence of bowel obstruction.  Normal appendix.  Vascular/Lymphatic: No evidence of abdominal aortic aneurysm.  No suspicious abdominopelvic lymphadenopathy.  Reproductive: Uterus is unremarkable.  Bilateral ovaries are within normal limits.  Other: No abdominopelvic ascites.  Musculoskeletal: Mild degenerative changes of the visualized thoracolumbar spine.  IMPRESSION: Mild gallbladder wall thickening/edema, poorly evaluated on CT, but raising the possibility of early acute cholecystitis. Consider right  upper quadrant ultrasound for further evaluation.   Electronically Signed   By: Julian Hy M.D.   On: 07/09/2014 00:58  [4 week]   Impression: 53 y/o female with acute onset ruq/epig pain associated with N/V. She had bump in LFTs/lipase. CT with no biliary dilation. Pancreas looked ok. Mild gb wall thickening/edema. ?biliary pancreatitis +/- acute cholecystitis.   Plan: 1. F/U abd u/s results.  2. NPO given pending test and pancreatitis.  3. Supportive measures.  4. LFTs AM. 5. Further recommendations to follow.   We would like to thank you for the opportunity to participate in the care of ELLON MARASCO.    LOS: 1 day   Neil Crouch  07/09/2014, 8:01 AM

## 2014-07-09 NOTE — ED Notes (Signed)
Dr. Barbaraann Faster at bedside.

## 2014-07-09 NOTE — ED Provider Notes (Signed)
Care assumed from Dr. Leonides Schanz at shift change awaiting results of a CT scan. Patient has been having upper nominal pain and was found to have an elevated WBC, elevated LFTs, and elevated lipase. CT scan reveals what appears to be acute cholecystitis. I have discussed this finding with Dr. Gala Romney who has recommended admission to the internal medicine service for IV antibiotics, pain control, and further workup.  Veryl Speak, MD 07/09/14 [phone]

## 2014-07-10 LAB — HEPATIC FUNCTION PANEL
ALBUMIN: 3.4 g/dL — AB (ref 3.5–5.2)
ALT: 144 U/L — AB (ref 0–35)
AST: 87 U/L — ABNORMAL HIGH (ref 0–37)
Alkaline Phosphatase: 107 U/L (ref 39–117)
Bilirubin, Direct: <0.2 mg/dL (ref 0.0–0.3)
TOTAL PROTEIN: 6.9 g/dL (ref 6.0–8.3)
Total Bilirubin: 0.3 mg/dL (ref 0.3–1.2)

## 2014-07-10 LAB — LIPASE, BLOOD: Lipase: 49 U/L (ref 11–59)

## 2014-07-10 MED ORDER — ACETAMINOPHEN 500 MG PO TABS
1000.0000 mg | ORAL_TABLET | Freq: Three times a day (TID) | ORAL | Status: DC | PRN
  Administered 2014-07-10: 1000 mg via ORAL
  Filled 2014-07-10: qty 2

## 2014-07-10 MED ORDER — LORATADINE 10 MG PO TABS
10.0000 mg | ORAL_TABLET | Freq: Every day | ORAL | Status: DC
Start: 2014-07-10 — End: 2014-07-10
  Administered 2014-07-10: 10 mg via ORAL
  Filled 2014-07-10: qty 1

## 2014-07-10 MED ORDER — TRAMADOL HCL 50 MG PO TABS
50.0000 mg | ORAL_TABLET | Freq: Four times a day (QID) | ORAL | Status: DC | PRN
  Administered 2014-07-10: 50 mg via ORAL
  Filled 2014-07-10: qty 1

## 2014-07-10 MED ORDER — AMOXICILLIN-POT CLAVULANATE 875-125 MG PO TABS: 1.0000 | ORAL_TABLET | Freq: Two times a day (BID) | ORAL | Status: DC

## 2014-07-10 MED ORDER — GUAIFENESIN ER 600 MG PO TB12
600.0000 mg | ORAL_TABLET | Freq: Two times a day (BID) | ORAL | Status: DC
  Administered 2014-07-10: 600 mg via ORAL
  Filled 2014-07-10: qty 1

## 2014-07-10 NOTE — Progress Notes (Signed)
Notified Dr. 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  Recommended to MD Mucinex and  home dose of Tramadol and claratin.  New orders given and followed.  Plan of care discussed with the patient.

## 2014-07-10 NOTE — Discharge Summary (Signed)
Physician Discharge Summary  Kristine Young:[id number] DOB: [birth year] DOA: 07/08/2014  PCP: Monico Blitz, MD  Admit date: 07/08/2014 Discharge date: 07/10/2014  Time spent: 35 minutes  Recommendations for Outpatient Follow-up:  1. Follow up with PCP in 1-2 weeks 2. Follow up with Dr. Arnoldo Morale as scheduled  Discharge Diagnoses:  Active Problems:   Right upper quadrant pain   Discharge Condition: Improved  Diet recommendation: Low Fat  Filed Weights   07/08/14 2043  Weight: 84.369 kg (186 lb)    History of present illness:  Please see admit h and p from 10/1 for details. Briefly, pt presents with abd pain with ct and bloodwork evidence of cholecystitis. Pt was admitted to the inpatient service for further workup.  Hospital Course:  Cholecystitis: CT and labs suggestive of cholecystitis.  - GI was consulted   - Abd [place] had evidence of gallstones with thickening comparable with cholecystitis - Pt was cont IVF hydration  - The patient's LFT's gradually trended towards normal - Symptoms improved markedly - General surgery was consulted with recommendations for outpatient follow up - The patient will be discharged with augmentin PO  Pancreatitis:  - Likely gallstone pancreatitis  - Resolved with NPO, supportive care   Leukocytosis:  - likely secondary to cholecystitis and pancreatitis.  - Afebrile  - WBC normalized  HTN:  - Remained normotensive  - continued home lisinopril and HCTZ   Depression: well controlled per pt.  - continued home FLuoxetine   Chronic pain: primarily neuropathic.  - Cont lyrica   DVT Prophylaxis:  - Heparin Tavares TID  Consultations:  GI  General Surgery  Discharge Exam: Filed Vitals:   07/09/14 1201 07/09/14 2108 07/10/14 0550 07/10/14 1117  BP: 138/69 132/73 107/58 126/68  Pulse: 68 74 64   Temp: 98.9 F (37.2 C) 99.2 F (37.3 C) 98.7 F (37.1 C)   TempSrc: Oral Oral Oral   Resp: 18 18 18    Height:      Weight:      SpO2:  100% 100% 98%    General: Awake, in nad Cardiovascular: regular, s1, s2 Respiratory: normal resp effort, no wheezing  Discharge Instructions    Medication List         amoxicillin-clavulanate 875-125 MG per tablet  Commonly known as:  AUGMENTIN  Take 1 tablet by mouth 2 (two) times daily.     atorvastatin 20 MG tablet  Commonly known as:  LIPITOR  Take 20 mg by mouth daily.     cyclobenzaprine 10 MG tablet  Commonly known as:  FLEXERIL  Take 10 mg by mouth 3 (three) times daily as needed for muscle spasms.     diclofenac 75 MG EC tablet  Commonly known as:  VOLTAREN  Take 75 mg by mouth daily as needed.     FLUoxetine 20 MG capsule  Commonly known as:  PROZAC  Take 20 mg by mouth daily.     lisinopril-hydrochlorothiazide 10-12.5 MG per tablet  Commonly known as:  PRINZIDE,ZESTORETIC  Take 1 tablet by mouth daily.     loratadine 10 MG tablet  Commonly known as:  CLARITIN  Take 10 mg by mouth daily as needed for allergies.     pregabalin 75 MG capsule  Commonly known as:  LYRICA  Take 75 mg by mouth 2 (two) times daily.     traMADol 50 MG tablet  Commonly known as:  ULTRAM  Take 50 mg by mouth every 6 (six) hours as needed for moderate pain.  Allergies  Allergen Reactions  . Tetracyclines & Related    Follow-up Information   Call Jamesetta So, MD. (call my office to make an appointment)    Specialty:  General Surgery   Contact information:   [address] [place] [address] [phone]       Follow up with [facility], MD. Schedule an appointment as soon as possible for a visit in 1 week.   Specialty:  Internal Medicine   Contact information:   [place] [address] [phone]        The results of significant diagnostics from this hospitalization (including imaging, microbiology, ancillary and laboratory) are listed below for reference.    Significant Diagnostic Studies: Ct Abdomen Pelvis W Contrast  07/09/2014    CLINICAL DATA:  Epigastric and right upper quadrant pain, nausea/vomiting, constipation, diarrhea  EXAM: CT ABDOMEN AND PELVIS WITH CONTRAST  TECHNIQUE: Multidetector CT imaging of the abdomen and pelvis was performed using the standard protocol following bolus administration of intravenous contrast.  CONTRAST:  191mL OMNIPAQUE IOHEXOL 300 MG/ML  SOLN  COMPARISON:  08/17/2008  FINDINGS: Lower chest:  Lung bases are clear.  Hepatobiliary: Liver is within normal limits.  Mild gallbladder wall thickening/edema (series 2/image 36).  No intrahepatic or extrahepatic ductal dilatation.  Pancreas: Within normal limits.  Spleen: Within normal limits.  Adrenals/Urinary Tract: Adrenal glands are unremarkable.  Left renal sinus cysts. Right kidney is within normal limits. No hydronephrosis.  Bladder is within normal limits.  Stomach/Bowel: Stomach is unremarkable.  No evidence of bowel obstruction.  Normal appendix.  Vascular/Lymphatic: No evidence of abdominal aortic aneurysm.  No suspicious abdominopelvic lymphadenopathy.  Reproductive: Uterus is unremarkable.  Bilateral ovaries are within normal limits.  Other: No abdominopelvic ascites.  Musculoskeletal: Mild degenerative changes of the visualized thoracolumbar spine.  IMPRESSION: Mild gallbladder wall thickening/edema, poorly evaluated on CT, but raising the possibility of early acute cholecystitis. Consider right upper quadrant ultrasound for further evaluation.   Electronically Signed   By: Julian Hy M.D.   On: 07/09/2014 00:58   Mr 3d Recon At Scanner  07/09/2014   CLINICAL DATA:  Right upper quadrant pain. Nausea and vomiting. Biliary dilatation and possible cholecystitis on recent ultrasound.  EXAM: MRI ABDOMEN WITHOUT AND WITH CONTRAST (INCLUDING MRCP)  TECHNIQUE: Multiplanar multisequence MR imaging of the abdomen was performed both before and after the administration of intravenous contrast. Heavily T2-weighted images of the biliary and pancreatic  ducts were obtained, and three-dimensional MRCP images were rendered by post processing.  CONTRAST:  37mL MULTIHANCE GADOBENATE DIMEGLUMINE 529 MG/ML IV SOLN  COMPARISON:  Ultrasound on 07/09/2014  FINDINGS: Lower chest:  Unremarkable.  Hepatobiliary: No mass or other hepatic parenchymal abnormality identified. Tiny gallstones are seen in the gallbladder shows minimal wall thickening, but no evidence of pericholecystic inflammatory changes or fluid. No evidence of biliary ductal dilatation with common bile duct measuring approximately 5 mm. No evidence of choledocholithiasis.  Pancreas: No mass, inflammatory changes, or other parenchymal abnormality identified. No evidence of pancreatic ductal dilatation or pancreas divisum.  Spleen:  Within normal limits in size and appearance.  Adrenal Glands:  No mass identified.  Kidneys: No masses identified. No evidence of hydronephrosis. Left renal parapelvic cysts incidentally noted.  Stomach/Bowel/Peritoneum: Visualized portions within the abdomen are unremarkable.  Vascular/Lymphatic: No pathologically enlarged lymph nodes identified. No other significant abnormality noted.  Other:  None.  Musculoskeletal:  No suspicious bone lesions identified.  IMPRESSION: Cholelithiasis. Mild gallbladder wall thickening, without significant pericholecystic inflammatory  changes are fluid. Consider nuclear medicine HIDA scan for further evaluation if clinically warranted.  No evidence of biliary dilatation or choledocholithiasis.  Left renal peripelvic cysts.  No evidence of hydronephrosis.   Electronically Signed   By: Earle Gell M.D.   On: 07/09/2014 15:46   Mr Abd W/wo Cm/mrcp  07/09/2014   CLINICAL DATA:  Right upper quadrant pain. Nausea and vomiting. Biliary dilatation and possible cholecystitis on recent ultrasound.  EXAM: MRI ABDOMEN WITHOUT AND WITH CONTRAST (INCLUDING MRCP)  TECHNIQUE: Multiplanar multisequence MR imaging of the abdomen was performed both before and after  the administration of intravenous contrast. Heavily T2-weighted images of the biliary and pancreatic ducts were obtained, and three-dimensional MRCP images were rendered by post processing.  CONTRAST:  10mL MULTIHANCE GADOBENATE DIMEGLUMINE 529 MG/ML IV SOLN  COMPARISON:  Ultrasound on 07/09/2014  FINDINGS: Lower chest:  Unremarkable.  Hepatobiliary: No mass or other hepatic parenchymal abnormality identified. Tiny gallstones are seen in the gallbladder shows minimal wall thickening, but no evidence of pericholecystic inflammatory changes or fluid. No evidence of biliary ductal dilatation with common bile duct measuring approximately 5 mm. No evidence of choledocholithiasis.  Pancreas: No mass, inflammatory changes, or other parenchymal abnormality identified. No evidence of pancreatic ductal dilatation or pancreas divisum.  Spleen:  Within normal limits in size and appearance.  Adrenal Glands:  No mass identified.  Kidneys: No masses identified. No evidence of hydronephrosis. Left renal parapelvic cysts incidentally noted.  Stomach/Bowel/Peritoneum: Visualized portions within the abdomen are unremarkable.  Vascular/Lymphatic: No pathologically enlarged lymph nodes identified. No other significant abnormality noted.  Other:  None.  Musculoskeletal:  No suspicious bone lesions identified.  IMPRESSION: Cholelithiasis. Mild gallbladder wall thickening, without significant pericholecystic inflammatory changes are fluid. Consider nuclear medicine HIDA scan for further evaluation if clinically warranted.  No evidence of biliary dilatation or choledocholithiasis.  Left renal peripelvic cysts.  No evidence of hydronephrosis.   Electronically Signed   By: Earle Gell M.D.   On: 07/09/2014 15:46   US Abdomen Limited Ruq  07/09/2014   CLINICAL DATA:  Right upper quadrant pain.  EXAM: US ABDOMEN LIMITED - RIGHT UPPER QUADRANT  COMPARISON:  CT from 07/09/2014  FINDINGS: Gallbladder:  Multiple low gallstones are identified  within the gallbladder. The gallbladder wall is thickened measuring up to 7 mm. Negative sonographic Murphy's sign.  Common bile duct:  Diameter: 6.8 mm.  Liver:  No focal lesion identified.  Increased parenchymal echogenicity.  IMPRESSION: 1. Gallstones and gallbladder wall thickening compatible with cholecystitis. 2. Mild increase caliber of the common bile duct which measures 6.8 mm. If there is a concern for choledocholithiasis an MRCP may provide additional diagnostic information. 3. Hepatic steatosis.   Electronically Signed   By: Kerby Moors M.D.   On: 07/09/2014 10:25    Microbiology: Recent Results (from the past 240 hour(s))  MRSA PCR SCREENING     Status: None   Collection Time    07/09/14  2:50 AM      Result Value Ref Range Status   MRSA by PCR NEGATIVE  NEGATIVE Final   Comment:            The GeneXpert MRSA Assay (FDA     approved for NASAL specimens     only), is one component of a     comprehensive MRSA colonization     surveillance program. It is not     intended to diagnose MRSA     infection nor to guide or  monitor treatment for     MRSA infections.     Labs: Basic Metabolic Panel:  Recent Labs Lab 07/08/14 2124 07/09/14 0658  NA 140 143  K 3.7 3.9  CL 102 106  CO2 26 27  GLUCOSE 103* 96  BUN 14 9  CREATININE 0.78 0.66  CALCIUM 9.5 8.9   Liver Function Tests:  Recent Labs Lab 07/08/14 2124 07/09/14 0658 07/10/14 0625  AST 372* 311* 87*  ALT 185* 250* 144*  ALKPHOS 121* 126* 107  BILITOT 0.6 0.7 0.3  PROT 8.1 7.4 6.9  ALBUMIN 4.1 3.7 3.4*    Recent Labs Lab 07/08/14 2257 07/10/14 0625  LIPASE 2330* 49   No results found for this basename: AMMONIA,  in the last 168 hours CBC:  Recent Labs Lab 07/08/14 2124 07/09/14 0658  WBC 15.1* 10.3  NEUTROABS 13.3*  --   HGB 12.8 12.6  HCT 37.5 36.9  MCV 93.8 94.1  PLT 270 263   Cardiac Enzymes:  Recent Labs Lab 07/08/14 2257  TROPONINI <0.30   BNP: BNP (last 3 results) No  results found for this basename: PROBNP,  in the last 8760 hours CBG: No results found for this basename: GLUCAP,  in the last 168 hours  Signed:  Aliviya Schoeller K  [facility] 07/10/2014, 2:36 PM

## 2014-07-10 NOTE — Progress Notes (Signed)
REVIEWED. AGREE. NO ADDITIONAL RECOMMENDATIONS. 

## 2014-07-10 NOTE — Progress Notes (Signed)
Patient discharged with instructions, prescription, and care notes.  Verbalized understanding via teach back.  IV was removed and the site was WNL. Patient voiced no further complaints or concerns at the time of discharge.  Appointments scheduled per instructions.  Patient left the floor via w/c with staff and family in stable condition. 

## 2014-07-10 NOTE — Progress Notes (Signed)
Subjective: No abdominal pain. No N/V. Tolerating clears.   Objective: Vital signs in last 24 hours: Temp:  [98.7 F (37.1 C)-99.2 F (37.3 C)] 98.7 F (37.1 C) (10/02 0550) Pulse Rate:  [64-74] 64 (10/02 0550) Resp:  [18] 18 (10/02 0550) BP: (107-138)/(58-73) 107/58 mmHg (10/02 0550) SpO2:  [98 %-100 %] 98 % (10/02 0550) Last BM Date: 07/09/14 General:   Alert and oriented, pleasant Head:  Normocephalic and atraumatic. Eyes:  No icterus, sclera clear. Conjuctiva pink.  Abdomen:  Bowel sounds present, soft, non-tender, non-distended. No HSM or hernias noted. No rebound or guarding. No masses appreciated  Msk:  Symmetrical without gross deformities. Normal posture. Neurologic:  Alert and  oriented x4;  grossly normal neurologically. Psych:  Alert and cooperative. Normal mood and affect.  Intake/Output from previous day: 10/01 0701 - 10/02 0700 In: 2601.7 [P.O.:240; I.V.:2361.7] Out: 200 [Urine:200] Intake/Output this shift:    Lab Results:  Recent Labs  07/08/14 2124 07/09/14 0658  WBC 15.1* 10.3  HGB 12.8 12.6  HCT 37.5 36.9  PLT 270 263   BMET  Recent Labs  07/08/14 2124 07/09/14 0658  NA 140 143  K 3.7 3.9  CL 102 106  CO2 26 27  GLUCOSE 103* 96  BUN 14 9  CREATININE 0.78 0.66  CALCIUM 9.5 8.9   LFT  Recent Labs  07/08/14 2124 07/09/14 0658 07/10/14 0625  PROT 8.1 7.4 6.9  ALBUMIN 4.1 3.7 3.4*  AST 372* 311* 87*  ALT 185* 250* 144*  ALKPHOS 121* 126* 107  BILITOT 0.6 0.7 0.3  BILIDIR  --   --  <0.2  IBILI  --   --  NOT CALCULATED   PT/INR  Recent Labs  07/09/14 0658  LABPROT 14.8  INR 1.16   Lab Results  Component Value Date   LIPASE 49 07/10/2014     Studies/Results: Ct Abdomen Pelvis W Contrast  07/09/2014   CLINICAL DATA:  Epigastric and right upper quadrant pain, nausea/vomiting, constipation, diarrhea  EXAM: CT ABDOMEN AND PELVIS WITH CONTRAST  TECHNIQUE: Multidetector CT imaging of the abdomen and pelvis was performed  using the standard protocol following bolus administration of intravenous contrast.  CONTRAST:  138mL OMNIPAQUE IOHEXOL 300 MG/ML  SOLN  COMPARISON:  08/17/2008  FINDINGS: Lower chest:  Lung bases are clear.  Hepatobiliary: Liver is within normal limits.  Mild gallbladder wall thickening/edema (series 2/image 36).  No intrahepatic or extrahepatic ductal dilatation.  Pancreas: Within normal limits.  Spleen: Within normal limits.  Adrenals/Urinary Tract: Adrenal glands are unremarkable.  Left renal sinus cysts. Right kidney is within normal limits. No hydronephrosis.  Bladder is within normal limits.  Stomach/Bowel: Stomach is unremarkable.  No evidence of bowel obstruction.  Normal appendix.  Vascular/Lymphatic: No evidence of abdominal aortic aneurysm.  No suspicious abdominopelvic lymphadenopathy.  Reproductive: Uterus is unremarkable.  Bilateral ovaries are within normal limits.  Other: No abdominopelvic ascites.  Musculoskeletal: Mild degenerative changes of the visualized thoracolumbar spine.  IMPRESSION: Mild gallbladder wall thickening/edema, poorly evaluated on CT, but raising the possibility of early acute cholecystitis. Consider right upper quadrant ultrasound for further evaluation.   Electronically Signed   By: Julian Hy M.D.   On: 07/09/2014 00:58   Mr 3d Recon At Scanner  07/09/2014   CLINICAL DATA:  Right upper quadrant pain. Nausea and vomiting. Biliary dilatation and possible cholecystitis on recent ultrasound.  EXAM: MRI ABDOMEN WITHOUT AND WITH CONTRAST (INCLUDING MRCP)  TECHNIQUE: Multiplanar multisequence MR imaging of the abdomen  was performed both before and after the administration of intravenous contrast. Heavily T2-weighted images of the biliary and pancreatic ducts were obtained, and three-dimensional MRCP images were rendered by post processing.  CONTRAST:  18mL MULTIHANCE GADOBENATE DIMEGLUMINE 529 MG/ML IV SOLN  COMPARISON:  Ultrasound on 07/09/2014  FINDINGS: Lower chest:   Unremarkable.  Hepatobiliary: No mass or other hepatic parenchymal abnormality identified. Tiny gallstones are seen in the gallbladder shows minimal wall thickening, but no evidence of pericholecystic inflammatory changes or fluid. No evidence of biliary ductal dilatation with common bile duct measuring approximately 5 mm. No evidence of choledocholithiasis.  Pancreas: No mass, inflammatory changes, or other parenchymal abnormality identified. No evidence of pancreatic ductal dilatation or pancreas divisum.  Spleen:  Within normal limits in size and appearance.  Adrenal Glands:  No mass identified.  Kidneys: No masses identified. No evidence of hydronephrosis. Left renal parapelvic cysts incidentally noted.  Stomach/Bowel/Peritoneum: Visualized portions within the abdomen are unremarkable.  Vascular/Lymphatic: No pathologically enlarged lymph nodes identified. No other significant abnormality noted.  Other:  None.  Musculoskeletal:  No suspicious bone lesions identified.  IMPRESSION: Cholelithiasis. Mild gallbladder wall thickening, without significant pericholecystic inflammatory changes are fluid. Consider nuclear medicine HIDA scan for further evaluation if clinically warranted.  No evidence of biliary dilatation or choledocholithiasis.  Left renal peripelvic cysts.  No evidence of hydronephrosis.   Electronically Signed   By: Earle Gell M.D.   On: 07/09/2014 15:46   Mr Abd W/wo Cm/mrcp  07/09/2014   CLINICAL DATA:  Right upper quadrant pain. Nausea and vomiting. Biliary dilatation and possible cholecystitis on recent ultrasound.  EXAM: MRI ABDOMEN WITHOUT AND WITH CONTRAST (INCLUDING MRCP)  TECHNIQUE: Multiplanar multisequence MR imaging of the abdomen was performed both before and after the administration of intravenous contrast. Heavily T2-weighted images of the biliary and pancreatic ducts were obtained, and three-dimensional MRCP images were rendered by post processing.  CONTRAST:  22mL MULTIHANCE  GADOBENATE DIMEGLUMINE 529 MG/ML IV SOLN  COMPARISON:  Ultrasound on 07/09/2014  FINDINGS: Lower chest:  Unremarkable.  Hepatobiliary: No mass or other hepatic parenchymal abnormality identified. Tiny gallstones are seen in the gallbladder shows minimal wall thickening, but no evidence of pericholecystic inflammatory changes or fluid. No evidence of biliary ductal dilatation with common bile duct measuring approximately 5 mm. No evidence of choledocholithiasis.  Pancreas: No mass, inflammatory changes, or other parenchymal abnormality identified. No evidence of pancreatic ductal dilatation or pancreas divisum.  Spleen:  Within normal limits in size and appearance.  Adrenal Glands:  No mass identified.  Kidneys: No masses identified. No evidence of hydronephrosis. Left renal parapelvic cysts incidentally noted.  Stomach/Bowel/Peritoneum: Visualized portions within the abdomen are unremarkable.  Vascular/Lymphatic: No pathologically enlarged lymph nodes identified. No other significant abnormality noted.  Other:  None.  Musculoskeletal:  No suspicious bone lesions identified.  IMPRESSION: Cholelithiasis. Mild gallbladder wall thickening, without significant pericholecystic inflammatory changes are fluid. Consider nuclear medicine HIDA scan for further evaluation if clinically warranted.  No evidence of biliary dilatation or choledocholithiasis.  Left renal peripelvic cysts.  No evidence of hydronephrosis.   Electronically Signed   By: Earle Gell M.D.   On: 07/09/2014 15:46   US Abdomen Limited Ruq  07/09/2014   CLINICAL DATA:  Right upper quadrant pain.  EXAM: US ABDOMEN LIMITED - RIGHT UPPER QUADRANT  COMPARISON:  CT from 07/09/2014  FINDINGS: Gallbladder:  Multiple low gallstones are identified within the gallbladder. The gallbladder wall is thickened measuring up to 7 mm. Negative sonographic Murphy's  sign.  Common bile duct:  Diameter: 6.8 mm.  Liver:  No focal lesion identified.  Increased parenchymal  echogenicity.  IMPRESSION: 1. Gallstones and gallbladder wall thickening compatible with cholecystitis. 2. Mild increase caliber of the common bile duct which measures 6.8 mm. If there is a concern for choledocholithiasis an MRCP may provide additional diagnostic information. 3. Hepatic steatosis.   Electronically Signed   By: Kerby Moors M.D.   On: 07/09/2014 10:25    Assessment: 53 year old female with biliary pancreatitis, possible cholecystitis, MRCP without evidence of CBD stone. LFTs improving from admission. Lipase now normalized. Clinically, patient has improved significantly from admission with resolution of pain, tolerating clear liquids, and ready for diet advancement. Surgical consult requested for consideration of cholecystectomy. Soft, low fat diet for lunch. Clinically stable from a GI standpoint for discharge if appropriate from surgical standpoint.     Plan: PPI BID Low fat diet General surgery consult Stable from GI standpoint   Orvil Feil, ANP-BC [facility]    LOS: 2 days    07/10/2014, 8:23 AM

## 2014-07-10 NOTE — Care Management Note (Signed)
    Page 1 of 1   07/10/2014     5:12:19 PM CARE MANAGEMENT NOTE 07/10/2014  Patient:  Kristine Young, Kristine Young   Account Number:  [id number]  Date Initiated:  07/10/2014  Documentation initiated by:  Vladimir Creeks  Subjective/Objective Assessment:   Admitted with  abd pain, ? gall bladder pancreatitis. Pt is from home, is independent, and will return home at D/C     Action/Plan:   No needs identified   Anticipated DC Date:  07/10/2014   Anticipated DC Plan:        [place]  CM consult      Choice offered to / List presented to:             Status of service:  Completed, signed off Medicare Important Message given?   (If response is "NO", the following Medicare IM given date fields will be blank) Date Medicare IM given:   Medicare IM given by:   Date Additional Medicare IM given:   Additional Medicare IM given by:    Discharge Disposition:  HOME/SELF CARE  Per UR Regulation:  Reviewed for med. necessity/level of care/duration of stay  If discussed at [place] of Stay Meetings, dates discussed:    Comments:  07/10/14 [place] RN/CM

## 2014-07-10 NOTE — Care Management Utilization Note (Signed)
UR completed 

## 2014-07-17 ENCOUNTER — Encounter (HOSPITAL_COMMUNITY): Payer: Self-pay

## 2014-07-17 ENCOUNTER — Encounter (HOSPITAL_COMMUNITY): Payer: Self-pay | Admitting: Pharmacy Technician

## 2014-07-17 ENCOUNTER — Encounter (HOSPITAL_COMMUNITY)
Admission: RE | Admit: 2014-07-17 | Discharge: 2014-07-17 | Disposition: A | Discharge status: Home / Self Care | Payer: BC Managed Care – PPO | Source: Ambulatory Visit | Attending: General Surgery | Admitting: General Surgery

## 2014-07-17 DIAGNOSIS — K8010 Calculus of gallbladder with chronic cholecystitis without obstruction: Secondary | ICD-10-CM | POA: Diagnosis present

## 2014-07-17 DIAGNOSIS — M797 Fibromyalgia: Secondary | ICD-10-CM | POA: Diagnosis not present

## 2014-07-17 DIAGNOSIS — F329 Major depressive disorder, single episode, unspecified: Secondary | ICD-10-CM | POA: Diagnosis not present

## 2014-07-17 DIAGNOSIS — M1990 Unspecified osteoarthritis, unspecified site: Secondary | ICD-10-CM | POA: Diagnosis not present

## 2014-07-17 DIAGNOSIS — I10 Essential (primary) hypertension: Secondary | ICD-10-CM | POA: Diagnosis not present

## 2014-07-17 DIAGNOSIS — E780 Pure hypercholesterolemia: Secondary | ICD-10-CM | POA: Diagnosis not present

## 2014-07-17 DIAGNOSIS — F419 Anxiety disorder, unspecified: Secondary | ICD-10-CM | POA: Diagnosis not present

## 2014-07-17 HISTORY — DX: Pure hypercholesterolemia, unspecified: E78.00

## 2014-07-17 HISTORY — DX: Anxiety disorder, unspecified: F41.9

## 2014-07-17 HISTORY — DX: Depression, unspecified: F32.A

## 2014-07-17 HISTORY — DX: Major depressive disorder, single episode, unspecified: F32.9

## 2014-07-17 LAB — BASIC METABOLIC PANEL
ANION GAP: 11 (ref 5–15)
BUN: 15 mg/dL (ref 6–23)
CO2: 28 meq/L (ref 19–32)
CREATININE: 0.72 mg/dL (ref 0.50–1.10)
Calcium: 9.9 mg/dL (ref 8.4–10.5)
Chloride: 101 mEq/L (ref 96–112)
GFR calc Af Amer: >90 mL/min (ref >90)
GFR calc non Af Amer: >90 mL/min (ref >90)
Glucose, Bld: 94 mg/dL (ref 70–99)
Potassium: 5.2 mEq/L (ref 3.7–5.3)
SODIUM: 140 meq/L (ref 137–147)

## 2014-07-17 LAB — CBC WITH DIFFERENTIAL/PLATELET
Basophils Absolute: 0 10*3/uL (ref 0.0–0.1)
Basophils Relative: 0 % (ref 0–1)
Eosinophils Absolute: 0.1 10*3/uL (ref 0.0–0.7)
Eosinophils Relative: 2 % (ref 0–5)
HEMATOCRIT: 38.4 % (ref 36.0–46.0)
Hemoglobin: 12.9 g/dL (ref 12.0–15.0)
LYMPHS PCT: 39 % (ref 12–46)
Lymphs Abs: 2.9 10*3/uL (ref 0.7–4.0)
MCH: 32 pg (ref 26.0–34.0)
MCHC: 33.6 g/dL (ref 30.0–36.0)
MCV: 95.3 fL (ref 78.0–100.0)
MONOS PCT: 7 % (ref 3–12)
Monocytes Absolute: 0.5 10*3/uL (ref 0.1–1.0)
NEUTROS ABS: 4 10*3/uL (ref 1.7–7.7)
Neutrophils Relative %: 52 % (ref 43–77)
Platelets: 277 10*3/uL (ref 150–400)
RBC: 4.03 MIL/uL (ref 3.87–5.11)
RDW: 12.7 % (ref 11.5–15.5)
WBC: 7.6 10*3/uL (ref 4.0–10.5)

## 2014-07-17 LAB — HEPATIC FUNCTION PANEL
ALK PHOS: 98 U/L (ref 39–117)
ALT: 68 U/L — AB (ref 0–35)
AST: 33 U/L (ref 0–37)
Albumin: 4.2 g/dL (ref 3.5–5.2)
BILIRUBIN TOTAL: 0.2 mg/dL — AB (ref 0.3–1.2)
Bilirubin, Direct: <0.2 mg/dL (ref 0.0–0.3)
Total Protein: 7.9 g/dL (ref 6.0–8.3)

## 2014-07-17 NOTE — Pre-Procedure Instructions (Signed)
Patient given information to sign up for my chart at home. 

## 2014-07-17 NOTE — H&P (Signed)
  NTS SOAP Note  Vital Signs:  Vitals as of: 38/10/173: Systolic 102: Diastolic 75: Heart Rate 70: Temp 97.28F: Height 48ft 3in: Weight 179Lbs 0 Ounces: Pain Level 2: BMI 31.71  BMI : 31.71 kg/m2  Subjective: This 53 year old female presents for of abdominal pain.  Recently admitted to hospital for cholecystitis,  cholelithiasis.  Is feeling somewhat better, presents for scheduling surgery.  Review of Symptoms:  Constitutional:unremarkable   Head:unremarkable Eyes:blurred vision bilateral Nose/Mouth/Throat:unremarkable Cardiovascular:  unremarkable Respiratory:unremarkable Gastrointestinabdominal pain, nausea Genitourinary:unremarkable   joint pain Skin:unremarkable Hematolgic/Lymphatic:unremarkable   Allergic/Immunologic:unremarkable   Past Medical History:  Reviewed  Past Medical History  Surgical History: unremarkable Medical Problems: high cholesterol,  HTN,  extrinsic allergies  Allergies: nkda Medications: augmentin,  atorvastatin,  cyclobenzaprine,  voltaren,  prozac,  prinzide,  claritin,  lyrica   Social History:Reviewed  Social History  Preferred Language: English Race:  White Ethnicity: Not Hispanic / Latino Age: 3 year Marital Status:  S Alcohol: unknown   Smoking Status: Never smoker reviewed on 07/16/2014 Functional Status reviewed on 07/16/2014 ------------------------------------------------ Bathing: Normal Cooking: Normal Dressing: Normal Driving: Normal Eating: Normal Managing Meds: Normal Oral Care: Normal Shopping: Normal Toileting: Normal Transferring: Normal Walking: Normal Cognitive Status reviewed on 07/16/2014 ------------------------------------------------ Attention: Normal Decision Making: Normal Language: Normal Memory: Normal Motor: Normal Perception: Normal Problem Solving: Normal Visual and Spatial: Normal   Family History:Reviewed  Family Health History Family History is  Unknown    Objective Information: General:Well appearing, well nourished in no distress. no scleral icterus Heart:RRR, no murmur Lungs:  CTA bilaterally, no wheezes, rhonchi, rales.  Breathing unlabored. Abdomen:Soft, slightly tender in right upper quadrant to palpation,  no rigidity,  ND, no HSM, no masses.  Hospital notes reviewed  Assessment:Cholecystitis,  cholelithiasis  Diagnoses: 574.00  K80.00 Calculus of gallbladder with acute cholecystitis (Calculus of gallbladder with acute cholecystitis without obstruction)  Procedures: 58527 - OFFICE OUTPATIENT NEW 30 MINUTES    Plan:  Scheduled for laparoscopic cholecystectomy on 07/20/14.   Patient Education:Alternative treatments to surgery were discussed with patient (and family).  Risks and benefits  of procedure including bleeding,  infection,  hepatobiliary injury,  and the possibility of an open procedure were fully explained to the patient (and family) who gave informed consent. Patient/family questions were addressed.  Follow-up:Pending Surgery

## 2014-07-17 NOTE — Patient Instructions (Signed)
Kristine Young  07/17/2014   Your procedure is scheduled on:  07/20/2014  Report to [name] at  1015  AM.  Call this number if you have problems the morning of surgery: [phone]   Remember:   Do not eat food or drink liquids after midnight.   Take these medicines the morning of surgery with A SIP OF WATER:  Flexaril, voltaren, prozac, lisinopril, claritin, lyrica, ultram   Do not wear jewelry, make-up or nail polish.  Do not wear lotions, powders, or perfumes.   Do not shave 48 hours prior to surgery. Men may shave face and neck.  Do not bring valuables to the hospital.  [facility] is not responsible  for any belongings or valuables.               Contacts, dentures or bridgework may not be worn into surgery.  Leave suitcase in the car. After surgery it may be brought to your room.  For patients admitted to the hospital, discharge time is determined by your treatment team.               Patients discharged the day of surgery will not be allowed to drive home.  Name and phone number of your driver: family  Special Instructions: Shower using CHG 2 nights before surgery and the night before surgery.  If you shower the day of surgery use CHG.  Use special wash - you have one bottle of CHG for all showers.  You should use approximately 1/3 of the bottle for each shower.   Please read over the following fact sheets that you were given: Pain Booklet, Coughing and Deep Breathing, Surgical Site Infection Prevention, Anesthesia Post-op Instructions and Care and Recovery After Surgery Laparoscopic Cholecystectomy Laparoscopic cholecystectomy is surgery to remove the gallbladder. The gallbladder is located in the upper right part of the abdomen, behind the liver. It is a storage sac for bile produced in the liver. Bile aids in the digestion and absorption of fats. Cholecystectomy is often done for inflammation of the gallbladder (cholecystitis). This condition is usually caused by a  buildup of gallstones (cholelithiasis) in your gallbladder. Gallstones can block the flow of bile, resulting in inflammation and pain. In severe cases, emergency surgery may be required. When emergency surgery is not required, you will have time to prepare for the procedure. Laparoscopic surgery is an alternative to open surgery. Laparoscopic surgery has a shorter recovery time. Your common bile duct may also need to be examined during the procedure. If stones are found in the common bile duct, they may be removed. LET [facility] CARE PROVIDER KNOW ABOUT:  Any allergies you have.  All medicines you are taking, including vitamins, herbs, eye drops, creams, and over-the-counter medicines.  Previous problems you or members of your family have had with the use of anesthetics.  Any blood disorders you have.  Previous surgeries you have had.  Medical conditions you have. RISKS AND COMPLICATIONS Generally, this is a safe procedure. However, as with any procedure, complications can occur. Possible complications include:  Infection.  Damage to the common bile duct, nerves, arteries, veins, or other internal organs such as the stomach, liver, or intestines.  Bleeding.  A stone may remain in the common bile duct.  A bile leak from the cyst duct that is clipped when your gallbladder is removed.  The need to convert to open surgery, which requires a larger incision in the abdomen. This may be  necessary if your surgeon thinks it is not safe to continue with a laparoscopic procedure. BEFORE THE PROCEDURE  Ask your health care provider about changing or stopping any regular medicines. You will need to stop taking aspirin or blood thinners at least 5 days prior to surgery.  Do not eat or drink anything after midnight the night before surgery.  Let your health care provider know if you develop a cold or other infectious problem before surgery. PROCEDURE   You will be given medicine to make you  sleep through the procedure (general anesthetic). A breathing tube will be placed in your mouth.  When you are asleep, your surgeon will make several small cuts (incisions) in your abdomen.  A thin, lighted tube with a tiny camera on the end (laparoscope) is inserted through one of the small incisions. The camera on the laparoscope sends a picture to a TV screen in the operating room. This gives the surgeon a good view inside your abdomen.  A gas will be pumped into your abdomen. This expands your abdomen so that the surgeon has more room to perform the surgery.  Other tools needed for the procedure are inserted through the other incisions. The gallbladder is removed through one of the incisions.  After the removal of your gallbladder, the incisions will be closed with stitches, staples, or skin glue. AFTER THE PROCEDURE  You will be taken to a recovery area where your progress will be checked often.  You may be allowed to go home the same day if your pain is controlled and you can tolerate liquids. Document Released: 09/25/2005 Document Revised: 07/16/2013 Document Reviewed: 05/07/2013 [facility] Patient Information 2015 [place], [place]. This information is not intended to replace advice given to you by your health care provider. Make sure you discuss any questions you have with your health care provider. PATIENT INSTRUCTIONS POST-ANESTHESIA  IMMEDIATELY FOLLOWING SURGERY:  Do not drive or operate machinery for the first twenty four hours after surgery.  Do not make any important decisions for twenty four hours after surgery or while taking narcotic pain medications or sedatives.  If you develop intractable nausea and vomiting or a severe headache please notify your doctor immediately.  FOLLOW-UP:  Please make an appointment with your surgeon as instructed. You do not need to follow up with anesthesia unless specifically instructed to do so.  WOUND CARE INSTRUCTIONS (if applicable):  Keep a  dry clean dressing on the anesthesia/puncture wound site if there is drainage.  Once the wound has quit draining you may leave it open to air.  Generally you should leave the bandage intact for twenty four hours unless there is drainage.  If the epidural site drains for more than 36-48 hours please call the anesthesia department.  QUESTIONS?:  Please feel free to call your physician or the hospital operator if you have any questions, and they will be happy to assist you.

## 2014-07-20 ENCOUNTER — Encounter (HOSPITAL_COMMUNITY)
Admission: RE | Disposition: A | Discharge status: Home / Self Care | Payer: Self-pay | Source: Ambulatory Visit | Attending: General Surgery

## 2014-07-20 ENCOUNTER — Ambulatory Visit (HOSPITAL_COMMUNITY): Payer: BC Managed Care – PPO | Admitting: Anesthesiology

## 2014-07-20 ENCOUNTER — Encounter (HOSPITAL_COMMUNITY): Payer: Self-pay | Admitting: *Deleted

## 2014-07-20 ENCOUNTER — Encounter (HOSPITAL_COMMUNITY): Payer: BC Managed Care – PPO | Admitting: Anesthesiology

## 2014-07-20 ENCOUNTER — Ambulatory Visit (HOSPITAL_COMMUNITY)
Admission: RE | Admit: 2014-07-20 | Discharge: 2014-07-20 | Disposition: A | Discharge status: Home / Self Care | Payer: BC Managed Care – PPO | Source: Ambulatory Visit | Attending: General Surgery | Admitting: General Surgery

## 2014-07-20 DIAGNOSIS — F419 Anxiety disorder, unspecified: Secondary | ICD-10-CM | POA: Insufficient documentation

## 2014-07-20 DIAGNOSIS — K8010 Calculus of gallbladder with chronic cholecystitis without obstruction: Secondary | ICD-10-CM | POA: Insufficient documentation

## 2014-07-20 DIAGNOSIS — M1990 Unspecified osteoarthritis, unspecified site: Secondary | ICD-10-CM | POA: Insufficient documentation

## 2014-07-20 DIAGNOSIS — I10 Essential (primary) hypertension: Secondary | ICD-10-CM | POA: Insufficient documentation

## 2014-07-20 DIAGNOSIS — F329 Major depressive disorder, single episode, unspecified: Secondary | ICD-10-CM | POA: Insufficient documentation

## 2014-07-20 DIAGNOSIS — M797 Fibromyalgia: Secondary | ICD-10-CM | POA: Insufficient documentation

## 2014-07-20 DIAGNOSIS — E780 Pure hypercholesterolemia: Secondary | ICD-10-CM | POA: Insufficient documentation

## 2014-07-20 HISTORY — PX: CHOLECYSTECTOMY: SHX55

## 2014-07-20 SURGERY — LAPAROSCOPIC CHOLECYSTECTOMY
Anesthesia: General | Site: Abdomen

## 2014-07-20 MED ORDER — CHLORHEXIDINE GLUCONATE 4 % EX LIQD: 1.0000 "application " | Freq: Once | CUTANEOUS | Status: DC

## 2014-07-20 MED ORDER — FENTANYL CITRATE 0.05 MG/ML IJ SOLN
INTRAMUSCULAR | Status: DC | PRN
  Administered 2014-07-20: 50 ug via INTRAVENOUS

## 2014-07-20 MED ORDER — NEOSTIGMINE METHYLSULFATE 10 MG/10ML IV SOLN
INTRAVENOUS | Status: DC | PRN
  Administered 2014-07-20: 3 mg via INTRAVENOUS

## 2014-07-20 MED ORDER — SODIUM CHLORIDE 0.9 % IR SOLN
Status: DC | PRN
  Administered 2014-07-20: 1000 mL

## 2014-07-20 MED ORDER — LACTATED RINGERS IV SOLN
INTRAVENOUS | Status: DC
  Administered 2014-07-20: 11:00:00 via INTRAVENOUS

## 2014-07-20 MED ORDER — DEXAMETHASONE SODIUM PHOSPHATE 4 MG/ML IJ SOLN
INTRAMUSCULAR | Status: AC
  Filled 2014-07-20: qty 1

## 2014-07-20 MED ORDER — FENTANYL CITRATE 0.05 MG/ML IJ SOLN
INTRAMUSCULAR | Status: AC
  Filled 2014-07-20: qty 2

## 2014-07-20 MED ORDER — ONDANSETRON HCL 4 MG/2ML IJ SOLN
INTRAMUSCULAR | Status: AC
  Filled 2014-07-20: qty 2

## 2014-07-20 MED ORDER — BUPIVACAINE HCL (PF) 0.5 % IJ SOLN
INTRAMUSCULAR | Status: DC | PRN
  Administered 2014-07-20: 10 mL

## 2014-07-20 MED ORDER — ONDANSETRON HCL 4 MG/2ML IJ SOLN
4.0000 mg | Freq: Once | INTRAMUSCULAR | Status: AC
  Administered 2014-07-20: 4 mg via INTRAVENOUS

## 2014-07-20 MED ORDER — FENTANYL CITRATE 0.05 MG/ML IJ SOLN
INTRAMUSCULAR | Status: AC
  Filled 2014-07-20: qty 5

## 2014-07-20 MED ORDER — OXYCODONE-ACETAMINOPHEN 7.5-325 MG PO TABS: 1.0000 | ORAL_TABLET | ORAL | Status: DC | PRN

## 2014-07-20 MED ORDER — HEMOSTATIC AGENTS (NO CHARGE) OPTIME
TOPICAL | Status: DC | PRN
  Administered 2014-07-20: 1

## 2014-07-20 MED ORDER — POVIDONE-IODINE 10 % OINT PACKET
TOPICAL_OINTMENT | CUTANEOUS | Status: DC | PRN
  Administered 2014-07-20: 1 via TOPICAL

## 2014-07-20 MED ORDER — ONDANSETRON HCL 4 MG/2ML IJ SOLN: 4.0000 mg | Freq: Once | INTRAMUSCULAR | Status: DC | PRN

## 2014-07-20 MED ORDER — GLYCOPYRROLATE 0.2 MG/ML IJ SOLN
INTRAMUSCULAR | Status: AC
  Filled 2014-07-20: qty 2

## 2014-07-20 MED ORDER — GLYCOPYRROLATE 0.2 MG/ML IJ SOLN
INTRAMUSCULAR | Status: DC | PRN
  Administered 2014-07-20: .6 mg via INTRAVENOUS

## 2014-07-20 MED ORDER — BUPIVACAINE HCL (PF) 0.5 % IJ SOLN
INTRAMUSCULAR | Status: AC
  Filled 2014-07-20: qty 30

## 2014-07-20 MED ORDER — DEXAMETHASONE SODIUM PHOSPHATE 4 MG/ML IJ SOLN
4.0000 mg | Freq: Once | INTRAMUSCULAR | Status: AC
  Administered 2014-07-20: 4 mg via INTRAVENOUS

## 2014-07-20 MED ORDER — MIDAZOLAM HCL 2 MG/2ML IJ SOLN
1.0000 mg | INTRAMUSCULAR | Status: DC | PRN
  Administered 2014-07-20: 2 mg via INTRAVENOUS

## 2014-07-20 MED ORDER — PROPOFOL 10 MG/ML IV BOLUS
INTRAVENOUS | Status: DC | PRN
  Administered 2014-07-20: 150 mg via INTRAVENOUS

## 2014-07-20 MED ORDER — LIDOCAINE HCL (PF) 1 % IJ SOLN
INTRAMUSCULAR | Status: AC
  Filled 2014-07-20: qty 5

## 2014-07-20 MED ORDER — SUCCINYLCHOLINE CHLORIDE 20 MG/ML IJ SOLN
INTRAMUSCULAR | Status: DC | PRN
  Administered 2014-07-20: 50 mg via INTRAVENOUS

## 2014-07-20 MED ORDER — FENTANYL CITRATE 0.05 MG/ML IJ SOLN
25.0000 ug | INTRAMUSCULAR | Status: DC | PRN
  Administered 2014-07-20 (×4): 25 ug via INTRAVENOUS

## 2014-07-20 MED ORDER — LIDOCAINE HCL (CARDIAC) 20 MG/ML IV SOLN
INTRAVENOUS | Status: DC | PRN
  Administered 2014-07-20: 30 mg via INTRAVENOUS

## 2014-07-20 MED ORDER — ROCURONIUM BROMIDE 50 MG/5ML IV SOLN
INTRAVENOUS | Status: AC
  Filled 2014-07-20: qty 1

## 2014-07-20 MED ORDER — CIPROFLOXACIN IN D5W 400 MG/200ML IV SOLN
400.0000 mg | INTRAVENOUS | Status: AC
  Administered 2014-07-20: 400 mg via INTRAVENOUS

## 2014-07-20 MED ORDER — KETOROLAC TROMETHAMINE 30 MG/ML IJ SOLN
30.0000 mg | Freq: Once | INTRAMUSCULAR | Status: AC
  Administered 2014-07-20: 30 mg via INTRAVENOUS
  Filled 2014-07-20: qty 1

## 2014-07-20 MED ORDER — POVIDONE-IODINE 10 % EX OINT
TOPICAL_OINTMENT | CUTANEOUS | Status: AC
  Filled 2014-07-20: qty 1

## 2014-07-20 MED ORDER — CIPROFLOXACIN IN D5W 400 MG/200ML IV SOLN
INTRAVENOUS | Status: AC
  Filled 2014-07-20: qty 200

## 2014-07-20 MED ORDER — MIDAZOLAM HCL 2 MG/2ML IJ SOLN
INTRAMUSCULAR | Status: AC
  Filled 2014-07-20: qty 2

## 2014-07-20 MED ORDER — ROCURONIUM BROMIDE 100 MG/10ML IV SOLN
INTRAVENOUS | Status: DC | PRN
  Administered 2014-07-20: 35 mg via INTRAVENOUS

## 2014-07-20 SURGICAL SUPPLY — 43 items
APPLIER CLIP LAPSCP 10X32 DD (CLIP) ×3 IMPLANT
BAG HAMPER (MISCELLANEOUS) ×3 IMPLANT
BAG SPEC RTRVL LRG 6X4 10 (ENDOMECHANICALS) ×1
BLADE 11 SAFETY STRL DISP (BLADE) ×3 IMPLANT
CLOTH BEACON ORANGE TIMEOUT ST (SAFETY) ×3 IMPLANT
COVER LIGHT HANDLE STERIS (MISCELLANEOUS) ×6 IMPLANT
DECANTER SPIKE VIAL GLASS SM (MISCELLANEOUS) ×3 IMPLANT
DURAPREP 26ML APPLICATOR (WOUND CARE) ×3 IMPLANT
ELECT REM PT RETURN 9FT ADLT (ELECTROSURGICAL) ×3
ELECTRODE REM PT RTRN 9FT ADLT (ELECTROSURGICAL) ×1 IMPLANT
FILTER SMOKE EVAC LAPAROSHD (FILTER) ×3 IMPLANT
FORMALIN 10 PREFIL 120ML (MISCELLANEOUS) ×3 IMPLANT
GLOVE BIOGEL PI IND STRL 7.0 (GLOVE) IMPLANT
GLOVE BIOGEL PI INDICATOR 7.0 (GLOVE) ×4
GLOVE ECLIPSE 6.5 STRL STRAW (GLOVE) ×4 IMPLANT
GLOVE EXAM NITRILE LRG STRL (GLOVE) ×3 IMPLANT
GLOVE SURG SS PI 7.5 STRL IVOR (GLOVE) ×3 IMPLANT
GOWN STRL REUS W/ TWL XL LVL3 (GOWN DISPOSABLE) ×1 IMPLANT
GOWN STRL REUS W/TWL LRG LVL3 (GOWN DISPOSABLE) ×6 IMPLANT
GOWN STRL REUS W/TWL XL LVL3 (GOWN DISPOSABLE) ×3
HEMOSTAT SNOW SURGICEL 2X4 (HEMOSTASIS) ×3 IMPLANT
INST SET LAPROSCOPIC AP (KITS) ×3 IMPLANT
KIT ROOM TURNOVER APOR (KITS) ×3 IMPLANT
MANIFOLD NEPTUNE II (INSTRUMENTS) ×3 IMPLANT
NDL INSUFFLATION 14GA 120MM (NEEDLE) ×1 IMPLANT
NEEDLE INSUFFLATION 14GA 120MM (NEEDLE) ×3 IMPLANT
NS IRRIG 1000ML POUR BTL (IV SOLUTION) ×3 IMPLANT
PACK LAP CHOLE LZT030E (CUSTOM PROCEDURE TRAY) ×3 IMPLANT
PAD ARMBOARD 7.5X6 YLW CONV (MISCELLANEOUS) ×3 IMPLANT
POUCH SPECIMEN RETRIEVAL 10MM (ENDOMECHANICALS) ×3 IMPLANT
SET BASIN LINEN APH (SET/KITS/TRAYS/PACK) ×3 IMPLANT
SLEEVE ENDOPATH XCEL 5M (ENDOMECHANICALS) ×3 IMPLANT
SPONGE GAUZE 2X2 8PLY STER LF (GAUZE/BANDAGES/DRESSINGS) ×4
SPONGE GAUZE 2X2 8PLY STRL LF (GAUZE/BANDAGES/DRESSINGS) ×8 IMPLANT
STAPLER VISISTAT (STAPLE) ×3 IMPLANT
SUT VICRYL 0 UR6 27IN ABS (SUTURE) ×3 IMPLANT
TAPE CLOTH SURG 4X10 WHT LF (GAUZE/BANDAGES/DRESSINGS) ×2 IMPLANT
TROCAR ENDO BLADELESS 11MM (ENDOMECHANICALS) ×3 IMPLANT
TROCAR XCEL NON-BLD 5MMX100MML (ENDOMECHANICALS) ×3 IMPLANT
TROCAR XCEL UNIV SLVE 11M 100M (ENDOMECHANICALS) ×3 IMPLANT
TUBING INSUFFLATION (TUBING) ×3 IMPLANT
WARMER LAPAROSCOPE (MISCELLANEOUS) ×3 IMPLANT
YANKAUER SUCT 12FT TUBE ARGYLE (SUCTIONS) ×3 IMPLANT

## 2014-07-20 NOTE — Op Note (Signed)
Patient:  Kristine Young  DOB:  [birth year]  MRN:  [id number]   Preop Diagnosis:  Cholecystitis, cholelithiasis  Postop Diagnosis:  Same  Procedure:  Laparoscopic cholecystectomy  Surgeon:  Aviva Signs, M.D.  Anes:  General endotracheal  Indications:  Patient is a 53 year old white female who presents with biliary colic secondary to cholelithiasis. The risks and benefits of the procedure including bleeding, infection, hepatobiliary injury, the possibility of an open procedure were fully explained to the patient, who gave informed consent.  Procedure note:  Patient was placed in the supine position. After induction of general endotracheal anesthesia, the abdomen was prepped and draped using usual sterile technique with DuraPrep. Surgical site confirmation was performed.  A supraumbilical incision was made down to the fascia. A Veress needle was introduced into the abdominal cavity and confirmation of placement was done using the saline drop test. The abdomen was then insufflated to 16 mm mercury pressure. An 11 mm trocar was introduced into the abdominal cavity under direct visualization without difficulty. The patient was placed in reverse Trendelenburg position and additional 11 mm trocar was placed the epigastric region and 5 mm trochars were placed the right upper quadrant and right flank regions. Liver was inspected and noted within normal limits. Gallbladder was retracted in a dynamic fashion in order to expose the triangle of Calot. The cystic duct was first identified. Its junction to the infundibulum was fully identified. Endoclips were placed proximally distally on the cystic duct, and the cystic duct was divided. This was likewise done cystic artery. The gallbladder was freed away from the gallbladder fossa using Bovie electrocautery. The gallbladder was delivered through the epigastric trocar site using an Endo Catch bag. The gallbladder fossa was inspected and no abnormal bleeding or  bile leakage was noted. Surgicel is placed the gallbladder fossa. All fluid and air were then evacuated from the abdominal cavity prior to removal of the trochars.  All wounds were irrigated with normal saline. All wounds were injected with 0.5% Sensorcaine. The supraumbilical fascia as well as epigastric fascia were reapproximated using 0 Vicryl interrupted sutures. All skin incisions were closed using staples. Betadine ointment and dry sterile dressings were applied.  All tape and needle counts were correct at the end of the procedure. Patient was extubated in the operating room and transferred to PACU in stable condition.    Complications:  None  EBL:  Minimal  Specimen:  Gallbladder

## 2014-07-20 NOTE — Anesthesia Procedure Notes (Signed)
Procedure Name: Intubation Date/Time: 07/20/2014 11:47 AM Performed by: Tressie Stalker E Pre-anesthesia Checklist: Patient identified, Patient being monitored, Timeout performed, Emergency Drugs available and Suction available Patient Re-evaluated:Patient Re-evaluated prior to inductionOxygen Delivery Method: Circle System Utilized Preoxygenation: Pre-oxygenation with 100% oxygen Intubation Type: IV induction Ventilation: Mask ventilation without difficulty Laryngoscope Size: Mac and 3 Grade View: Grade II Tube type: Oral Tube size: 7.0 mm Number of attempts: 1 Airway Equipment and Method: stylet Placement Confirmation: ETT inserted through vocal cords under direct vision,  positive ETCO2 and breath sounds checked- equal and bilateral Secured at: 21 cm Tube secured with: Tape Dental Injury: Teeth and Oropharynx as per pre-operative assessment

## 2014-07-20 NOTE — Interval H&P Note (Signed)
History and Physical Interval Note:  07/20/2014 11:25 AM  Kristine Young  has presented today for surgery, with the diagnosis of cholelithiasis  The various methods of treatment have been discussed with the patient and family. After consideration of risks, benefits and other options for treatment, the patient has consented to  Procedure(s): LAPAROSCOPIC CHOLECYSTECTOMY (N/A) as a surgical intervention .  The patient's history has been reviewed, patient examined, no change in status, stable for surgery.  I have reviewed the patient's chart and labs.  Questions were answered to the patient's satisfaction.     Aviva Signs A

## 2014-07-20 NOTE — Transfer of Care (Signed)
Immediate Anesthesia Transfer of Care Note  Patient: Kristine Young  Procedure(s) Performed: Procedure(s): LAPAROSCOPIC CHOLECYSTECTOMY (N/A)  Patient Location: PACU  Anesthesia Type:General  Level of Consciousness: awake  Airway & Oxygen Therapy: Patient Spontanous Breathing and Patient connected to face mask oxygen  Post-op Assessment: Report given to PACU RN  Post vital signs: Reviewed and stable  Complications: No apparent anesthesia complications

## 2014-07-20 NOTE — Discharge Instructions (Signed)

## 2014-07-20 NOTE — Anesthesia Postprocedure Evaluation (Signed)
  Anesthesia Post-op Note  Patient: Kristine Young  Procedure(s) Performed: Procedure(s): LAPAROSCOPIC CHOLECYSTECTOMY (N/A)  Patient Location: PACU  Anesthesia Type:General  Level of Consciousness: awake and alert   Airway and Oxygen Therapy: Patient Spontanous Breathing and Patient connected to face mask oxygen  Post-op Pain: none  Post-op Assessment: Post-op Vital signs reviewed, Patient's Cardiovascular Status Stable, Respiratory Function Stable, Patent Airway and No signs of Nausea or vomiting  Post-op Vital Signs: Reviewed and stable  Last Vitals:  Filed Vitals:   07/20/14 1125  BP: 103/65  Pulse:   Temp:   Resp: 23    Complications: No apparent anesthesia complications,  Patient aspirated secretions, causing laryngospasm.  Positive pressure assistance with mask, spasm broke and spontaneous respirations and cough.  Sats down to 70's, back up to 100 within a few minutes.

## 2014-07-20 NOTE — Anesthesia Preprocedure Evaluation (Signed)
Anesthesia Evaluation  Patient identified by MRN, date of birth, ID band Patient awake    Reviewed: Allergy & Precautions, H&P , NPO status , Patient's Chart, lab work & pertinent test results  Airway Mallampati: I TM Distance: >3 FB     Dental  (+) Teeth Intact   Pulmonary neg pulmonary ROS,  breath sounds clear to auscultation        Cardiovascular hypertension, Pt. on medications Rhythm:Regular Rate:Normal     Neuro/Psych PSYCHIATRIC DISORDERS Anxiety Depression    GI/Hepatic negative GI ROS,   Endo/Other    Renal/GU      Musculoskeletal  (+) Arthritis -, Fibromyalgia -  Abdominal   Peds  Hematology   Anesthesia Other Findings   Reproductive/Obstetrics                           Anesthesia Physical Anesthesia Plan  ASA: II  Anesthesia Plan: General   Post-op Pain Management:    Induction: Intravenous  Airway Management Planned: Oral ETT  Additional Equipment:   Intra-op Plan:   Post-operative Plan: Extubation in OR  Informed Consent: I have reviewed the patients History and Physical, chart, labs and discussed the procedure including the risks, benefits and alternatives for the proposed anesthesia with the patient or authorized representative who has indicated his/her understanding and acceptance.     Plan Discussed with:   Anesthesia Plan Comments:         Anesthesia Quick Evaluation

## 2014-07-21 ENCOUNTER — Encounter (HOSPITAL_COMMUNITY): Payer: Self-pay | Admitting: General Surgery

## 2016-09-28 ENCOUNTER — Encounter (INDEPENDENT_AMBULATORY_CARE_PROVIDER_SITE_OTHER): Payer: Self-pay | Admitting: *Deleted

## 2016-10-10 ENCOUNTER — Ambulatory Visit: Payer: BLUE CROSS/BLUE SHIELD | Admitting: Obstetrics and Gynecology

## 2016-10-19 ENCOUNTER — Encounter: Payer: Self-pay | Admitting: Obstetrics and Gynecology

## 2016-10-19 ENCOUNTER — Ambulatory Visit (INDEPENDENT_AMBULATORY_CARE_PROVIDER_SITE_OTHER): Payer: BLUE CROSS/BLUE SHIELD | Admitting: Obstetrics and Gynecology

## 2016-10-19 VITALS — BP 110/70 | HR 76 | Resp 14 | Ht 62.25 in | Wt 174.0 lb

## 2016-10-19 DIAGNOSIS — N884 Hypertrophic elongation of cervix uteri: Secondary | ICD-10-CM

## 2016-10-19 DIAGNOSIS — Z124 Encounter for screening for malignant neoplasm of cervix: Secondary | ICD-10-CM | POA: Diagnosis not present

## 2016-10-19 NOTE — Progress Notes (Signed)
56 y.o. G0P0000 SingleAfrican AmericanF here for a consultation for a large cervical polyp, sent by Manuella Ghazi. The polyp was noted on routine pelvic exam. No vaginal bleeding. The patient requests a pap smear.   Not currently sexually active, has been in the past.   The patient is here with her twin sister Jackelyn Poling.    No LMP recorded. Patient is postmenopausal.          Sexually active: No.  The current method of family planning is post menopausal status.    Exercising: No.  The patient does not participate in regular exercise at present. Smoker:  no  Health Maintenance: Pap:  2016 WNL per patient  History of abnormal Pap:  Yes years ago MMG:  05-10-12 WNL  Colonoscopy:  Never BMD:   Never TDaP:  unsure Gardasil: N/A   reports that she has never smoked. She has never used smokeless tobacco. She reports that she does not drink alcohol or use drugs.Lives with her twin sister (they are adopted). Works as a Quarry manager.  Past Medical History:  Diagnosis Date  . Anxiety   . Arthritis   . Depression   . Fibroid   . Fibromyalgia   . Hypercholesterolemia   . Hypertension   . Neuropathy [facility])     Past Surgical History:  Procedure Laterality Date  . ABDOMINAL SURGERY    . CHOLECYSTECTOMY N/A 07/20/2014   Procedure: LAPAROSCOPIC CHOLECYSTECTOMY;  Surgeon: Jamesetta So, MD;  Location: [facility];  Service: General;  Laterality: N/A;  . EYE SURGERY Left    45 yrs ago- eye "went out of focus".  Marland Kitchen MYOMECTOMY     Women's- 15 yrs ago    Current Outpatient Prescriptions  Medication Sig Dispense Refill  . cyclobenzaprine (FLEXERIL) 10 MG tablet Take 10 mg by mouth 3 (three) times daily as needed for muscle spasms.    . diclofenac (VOLTAREN) 75 MG EC tablet Take 75 mg by mouth daily as needed.    Marland Kitchen FLUoxetine (PROZAC) 10 MG capsule   3  . FLUoxetine (PROZAC) 20 MG capsule Take 20 mg by mouth daily.    Marland Kitchen lisinopril-hydrochlorothiazide (PRINZIDE,ZESTORETIC) 10-12.5 MG per tablet Take 1 tablet by mouth  daily.     No current facility-administered medications for this visit.     Family History  Problem Relation Age of Onset  . Polycystic kidney disease Mother   . Stroke Mother   . Heart attack Father   . Heart failure Father   . Colon cancer Father     greater than age 65  . Hypertension Sister   . Hypothyroidism Sister   . Osteoarthritis Sister   . Hyperlipidemia Sister   . Liver disease Neg Hx   . Pancreatic disease Neg Hx     Review of Systems  Constitutional: Negative.   HENT: Negative.   Eyes: Negative.   Respiratory: Negative.   Cardiovascular: Negative.   Gastrointestinal: Negative.   Endocrine: Negative.   Genitourinary:       Cervical polyp   Musculoskeletal: Negative.   Skin: Negative.   Allergic/Immunologic: Negative.   Hematological: Negative.   Psychiatric/Behavioral: Negative.     Exam:   BP 110/70 (BP Location: Right Arm, Patient Position: Sitting, Cuff Size: Normal)   Pulse 76   Resp 14   Ht 5' 2.25" (1.581 m)   Wt 174 lb (78.9 kg)   BMI 31.57 kg/m   Weight change: @WEIGHTCHANGE @ Height:   Height: 5' 2.25" (158.1 cm)  Ht Readings from  Last 3 Encounters:  10/19/16 5' 2.25" (1.581 m)  07/08/14 5\' 3"  (1.6 m)    General appearance: alert, cooperative and appears stated age   Pelvic: External genitalia:  no lesions              Urethra:  normal appearing urethra with no masses, tenderness or lesions              Bartholins and Skenes: normal                 Vagina: normal appearing atrophic vagina with normal color and discharge, no lesions              Cervix: elongated and very stenotic (giving slightly atypical appearance), no polyp               Bimanual Exam:  Uterus:  normal size, contour, position, consistency, mobility, non-tender and 1cm lump felt on the left lateral uterus, c/w a small myoma              Adnexa: no mass, fullness, tenderness               Rectovaginal: Confirms               Anus:  normal sphincter tone, no  lesions  Chaperone was present for exam.  A:  Elongated cervix  Cervical stenosis  Screening cervical cancer  P:   Patient reassured  Pap with hpv done  CC: Dr Manuella Ghazi Letter sent

## 2016-10-20 ENCOUNTER — Encounter (HOSPITAL_COMMUNITY): Payer: Self-pay | Admitting: Emergency Medicine

## 2016-10-20 ENCOUNTER — Emergency Department (HOSPITAL_COMMUNITY)
Admission: EM | Admit: 2016-10-20 | Discharge: 2016-10-20 | Disposition: A | Discharge status: Home / Self Care | Payer: Worker's Compensation | Attending: Emergency Medicine | Admitting: Emergency Medicine

## 2016-10-20 ENCOUNTER — Emergency Department (HOSPITAL_COMMUNITY): Payer: Worker's Compensation

## 2016-10-20 DIAGNOSIS — I10 Essential (primary) hypertension: Secondary | ICD-10-CM | POA: Diagnosis not present

## 2016-10-20 DIAGNOSIS — Y939 Activity, unspecified: Secondary | ICD-10-CM | POA: Insufficient documentation

## 2016-10-20 DIAGNOSIS — Y929 Unspecified place or not applicable: Secondary | ICD-10-CM | POA: Diagnosis not present

## 2016-10-20 DIAGNOSIS — W108XXA Fall (on) (from) other stairs and steps, initial encounter: Secondary | ICD-10-CM | POA: Insufficient documentation

## 2016-10-20 DIAGNOSIS — M25552 Pain in left hip: Secondary | ICD-10-CM | POA: Insufficient documentation

## 2016-10-20 DIAGNOSIS — S30810A Abrasion of lower back and pelvis, initial encounter: Secondary | ICD-10-CM | POA: Insufficient documentation

## 2016-10-20 DIAGNOSIS — W19XXXA Unspecified fall, initial encounter: Secondary | ICD-10-CM

## 2016-10-20 DIAGNOSIS — S8992XA Unspecified injury of left lower leg, initial encounter: Secondary | ICD-10-CM | POA: Diagnosis present

## 2016-10-20 DIAGNOSIS — Z23 Encounter for immunization: Secondary | ICD-10-CM | POA: Insufficient documentation

## 2016-10-20 DIAGNOSIS — Y999 Unspecified external cause status: Secondary | ICD-10-CM | POA: Insufficient documentation

## 2016-10-20 DIAGNOSIS — M25562 Pain in left knee: Secondary | ICD-10-CM | POA: Insufficient documentation

## 2016-10-20 LAB — URINALYSIS, ROUTINE W REFLEX MICROSCOPIC
Glucose, UA: NEGATIVE mg/dL
Hgb urine dipstick: NEGATIVE
KETONES UR: 5 mg/dL — AB
LEUKOCYTES UA: NEGATIVE
Nitrite: NEGATIVE
PH: 5 (ref 5.0–8.0)
Protein, ur: NEGATIVE mg/dL
Specific Gravity, Urine: 1.029 (ref 1.005–1.030)

## 2016-10-20 MED ORDER — TETANUS-DIPHTH-ACELL PERTUSSIS 5-2.5-18.5 LF-MCG/0.5 IM SUSP
0.5000 mL | Freq: Once | INTRAMUSCULAR | Status: AC
  Administered 2016-10-20: 0.5 mL via INTRAMUSCULAR
  Filled 2016-10-20: qty 0.5

## 2016-10-20 MED ORDER — METHOCARBAMOL 500 MG PO TABS
500.0000 mg | ORAL_TABLET | Freq: Once | ORAL | Status: AC
  Administered 2016-10-20: 500 mg via ORAL
  Filled 2016-10-20: qty 1

## 2016-10-20 MED ORDER — DICLOFENAC SODIUM 50 MG PO TBEC: 50.0000 mg | DELAYED_RELEASE_TABLET | Freq: Two times a day (BID) | ORAL | 0 refills | Status: DC

## 2016-10-20 MED ORDER — KETOROLAC TROMETHAMINE 60 MG/2ML IM SOLN
30.0000 mg | Freq: Once | INTRAMUSCULAR | Status: AC
  Administered 2016-10-20: 30 mg via INTRAMUSCULAR
  Filled 2016-10-20: qty 2

## 2016-10-20 NOTE — ED Provider Notes (Signed)
[place] DEPT Provider Note   CSN: [id number] Arrival date & time: 10/20/16  1546     History   Chief Complaint Chief Complaint  Patient presents with  . Fall  . Knee Pain  . Hip Pain    HPI Kristine Young is a 56 y.o. female who presents to the ED with left hip and knee pain that started today s/p fall down steps. Patient was able to get up after the fall and get to her car, however, once in her car the pain was so bad that she called EMS to bring her to the ED. She denies head injury or LOC. Denies loss of control of bladder or bowels.   The history is provided by the patient. No language interpreter was used.  Fall  This is a new problem. The current episode started 1 to 2 hours ago. Pertinent negatives include no chest pain, no headaches and no shortness of breath. She has tried nothing for the symptoms.  Knee Pain   This is a new problem. The current episode started 1 to 2 hours ago. The problem occurs constantly. The problem has not changed since onset.The pain is present in the left knee. The quality of the pain is described as constant. The pain is at a severity of 7/10. She has tried nothing for the symptoms. There has been a history of trauma.  Hip Pain  This is a new problem. The current episode started 1 to 2 hours ago. The problem occurs constantly. Pertinent negatives include no chest pain, no headaches and no shortness of breath. The symptoms are aggravated by walking. Nothing relieves the symptoms. She has tried nothing for the symptoms.    Past Medical History:  Diagnosis Date  . Anxiety   . Arthritis   . Depression   . Fibroid   . Fibromyalgia   . Hypercholesterolemia   . Hypertension   . Neuropathy [facility])     Patient Active Problem List   Diagnosis Date Noted  . Right upper quadrant pain 07/09/2014    Past Surgical History:  Procedure Laterality Date  . ABDOMINAL SURGERY    . CHOLECYSTECTOMY N/A 07/20/2014   Procedure: LAPAROSCOPIC  CHOLECYSTECTOMY;  Surgeon: Jamesetta So, MD;  Location: [facility];  Service: General;  Laterality: N/A;  . EYE SURGERY Left    45 yrs ago- eye "went out of focus".  Marland Kitchen MYOMECTOMY     Women's- 15 yrs ago    OB History    Gravida Para Term Preterm AB Living   0 0 0 0 0 0   SAB TAB Ectopic Multiple Live Births   0 0 0 0 0       Home Medications    Prior to Admission medications   Medication Sig Start Date End Date Taking? Authorizing Provider  cyclobenzaprine (FLEXERIL) 10 MG tablet Take 10 mg by mouth 3 (three) times daily as needed for muscle spasms.    Historical Provider, MD  diclofenac (VOLTAREN) 50 MG EC tablet Take 1 tablet (50 mg total) by mouth 2 (two) times daily. 10/20/16   [place], NP  FLUoxetine (PROZAC) 10 MG capsule  08/18/16   Historical Provider, MD  FLUoxetine (PROZAC) 20 MG capsule Take 20 mg by mouth daily. 07/01/14   Historical Provider, MD  lisinopril-hydrochlorothiazide (PRINZIDE,ZESTORETIC) 10-12.5 MG per tablet Take 1 tablet by mouth daily. 06/10/14   Historical Provider, MD    Family History Family History  Problem Relation Age of Onset  .  Adopted: Yes  . Liver disease Neg Hx   . Pancreatic disease Neg Hx     Social History Social History  Substance Use Topics  . Smoking status: Never Smoker  . Smokeless tobacco: Never Used  . Alcohol use No     Allergies   Tetracyclines & related   Review of Systems Review of Systems  Constitutional: Negative for diaphoresis.  HENT: Negative.   Eyes: Negative for visual disturbance.  Respiratory: Negative for chest tightness and shortness of breath.   Cardiovascular: Negative for chest pain.  Gastrointestinal: Negative for nausea and vomiting.  Genitourinary: Negative for dysuria and frequency.  Musculoskeletal: Positive for back pain.       Left knee pain, left hip pain  Skin:       Abrasion of left buttock.  Neurological: Negative for syncope and headaches.  Psychiatric/Behavioral: Negative for  confusion.     Physical Exam Updated Vital Signs BP 135/68 (BP Location: Left Arm)   Pulse 106   Temp 98.4 F (36.9 C) (Oral)   Resp 18   Ht 5\' 2"  (1.575 m)   Wt 78.9 kg   SpO2 100%   BMI 31.83 kg/m   Physical Exam  Constitutional: She is oriented to person, place, and time. She appears well-developed and well-nourished. No distress.  HENT:  Head: Normocephalic and atraumatic.  Right Ear: Tympanic membrane normal.  Left Ear: Tympanic membrane normal.  Nose: Nose normal. No epistaxis.  Mouth/Throat: Uvula is midline, oropharynx is clear and moist and mucous membranes are normal. Normal dentition.  Eyes: Conjunctivae and EOM are normal. Pupils are equal, round, and reactive to light.  Neck: Normal range of motion. Neck supple.  Cardiovascular: Normal rate and regular rhythm.   Pulmonary/Chest: Effort normal. She has no wheezes. She has no rales.  Abdominal: Soft. Bowel sounds are normal. There is no tenderness.  Musculoskeletal: Normal range of motion. She exhibits no edema.       Lumbar back: She exhibits tenderness, pain and spasm. She exhibits normal pulse.  Radial pulses 2+, adequate circulation, pedal pulses 2+, straight leg raises without difficulty.  Neurological: She is alert and oriented to person, place, and time. She has normal strength. No cranial nerve deficit or sensory deficit. Gait normal. GCS eye subscore is 4. GCS verbal subscore is 5. GCS motor subscore is 6.  Reflex Scores:      Bicep reflexes are 2+ on the right side and 2+ on the left side.      Brachioradialis reflexes are 2+ on the right side and 2+ on the left side.      Patellar reflexes are 2+ on the right side and 2+ on the left side. Pain with ambulation but steady gait.  Skin: Skin is warm and dry.  Left buttock with large abrasion. Tender on exam  Psychiatric: She has a normal mood and affect. Her behavior is normal.  Nursing note and vitals reviewed.    ED Treatments / Results  Labs (all  labs ordered are listed, but only abnormal results are displayed) Labs Reviewed  URINALYSIS, ROUTINE W REFLEX MICROSCOPIC - Abnormal; Notable for the following:       Result Value   Color, Urine AMBER (*)    APPearance HAZY (*)    Bilirubin Urine SMALL (*)    Ketones, ur 5 (*)    All other components within normal limits     Radiology Dg Knee Complete 4 Views Left  Result Date: 10/20/2016 CLINICAL DATA:  Left  knee pain since a fall down stairs today. Initial encounter. EXAM: LEFT KNEE - COMPLETE 4+ VIEW COMPARISON:  None. FINDINGS: No acute bony or joint abnormality is identified. Joint spaces are preserved. Small osteophytes about the medial compartment are noted. No joint effusion. IMPRESSION: No acute abnormality. Mild medial compartment degenerative change. Electronically Signed   By: Inge Rise M.D.   On: 10/20/2016 16:28   Dg Hip Unilat W Or Wo Pelvis 2-3 Views Left  Result Date: 10/20/2016 CLINICAL DATA:  Fall.  Left knee and hip pain.  Initial encounter. EXAM: DG HIP (WITH OR WITHOUT PELVIS) 2-3V LEFT COMPARISON:  Bilateral hip radiographs 07/06/2004 FINDINGS: There is no evidence of acute fracture or dislocation. Hip joint space widths appear preserved. Mild marginal osteophyte formation is noted involving the right femoral head and acetabulum, progressed from 2005. No focal osseous lesion or soft tissue abnormality is seen. IMPRESSION: No evidence of acute osseous abnormality. Mild right hip osteoarthrosis. Electronically Signed   By: Logan Bores M.D.   On: 10/20/2016 16:33    Procedures Procedures (including critical care time)  Medications Ordered in ED Medications  Tdap (BOOSTRIX) injection 0.5 mL (0.5 mLs Intramuscular Given 10/20/16 1713)  methocarbamol (ROBAXIN) tablet 500 mg (500 mg Oral Given 10/20/16 1724)  ketorolac (TORADOL) injection 30 mg (30 mg Intramuscular Given 10/20/16 1725)     Initial Impression / Assessment and Plan / ED Course  I have reviewed the  triage vital signs and the nursing notes.  Pertinent labs & imaging results that were available during my care of the patient were reviewed by me and considered in my medical decision making (see chart for details).  Clinical Course    56 y.o. female with left knee and hip pain s/p fall stable for d/c without neuro deficits and no acute fracture or dislocation on x-ray. Discussed with the patient x-ray and lab results and plan of care and all questioned fully answered. She will return if any problems arise.  Final Clinical Impressions(s) / ED Diagnoses   Final diagnoses:  Fall, initial encounter  Acute pain of left knee  Pain of left hip joint    New Prescriptions New Prescriptions   DICLOFENAC (VOLTAREN) 50 MG EC TABLET    Take 1 tablet (50 mg total) by mouth 2 (two) times daily.     [place], NP 10/20/16 [place], MD 10/20/16 [phone]

## 2016-10-20 NOTE — Discharge Instructions (Signed)
Take the muscle relaxer that you have at home in addition to the medication we give you . Follow up with your doctor or return here as needed for worsening symptoms

## 2016-10-20 NOTE — ED Notes (Signed)
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

## 2016-10-20 NOTE — ED Triage Notes (Signed)
Pt presents via EMS. Patient states she slipped and fell down 3 concrete steps today. Was able to get up and make it to her car then called EMS.

## 2016-10-20 NOTE — ED Notes (Signed)
HN in to speak with pt

## 2016-10-24 LAB — IPS PAP TEST WITH HPV

## 2017-04-17 NOTE — Progress Notes (Signed)
Psychiatric Initial Adult Assessment   Patient Identification: Kristine Young MRN:  [id number] Date of Evaluation:  04/18/2017 Referral Source: DR. Monico Blitz  Chief Complaint:   Chief Complaint    Depression; Anxiety; Fatigue; Agitation     Visit Diagnosis:    ICD-10-CM   1. MDD (major depressive disorder), recurrent episode, moderate (HCC) F33.1     History of Present Illness:   Kristine Young is a 56 year old female with depression, peripheral neuropathy, who is referred for depression.   Patient states that she is here for depression. She states that she had a fall twice since January 2018 at work. She had been unable to continue her job due to her pain, and now she cannot find a good position, although she is back to her work. She is hoping to get job or disability. She feels very stressed about her pain, and thinks she would do much better if she does not have any pain. She was in "despair" when she visited Dr. Manuella Ghazi; she made a comment that she may hurt herself or other people (non specific people), and was placed on 24 hour hold. She states that it ws an expression to ask for help, but adamantly denies any plans or intent. She denies gun access at home.   She feels depressed and irritable. She has insomnia. She reports good appetite. She report decreased concentration. She endorses anhedonia and fatigue. She denies SI, HI, AH, VH. She feels anxious and reports anticipatory anxiety. She denies panic attacks. She reports a few hours of euphoria followed by depression and crying spells. She reports some impulsive shopping, although she would not do it now that she does not have money. She denies alcohol use or drug use.   Reviewed chart from Dr. Monico Blitz.  She could not tolerate both Prozac and flexeril. She was started on Lyrica.   Per [employer] NO data  Associated Signs/Symptoms: Depression Symptoms:  depressed mood, anhedonia, insomnia, fatigue, difficulty  concentrating, anxiety, (Hypo) Manic Symptoms:  Irritable Mood, Anxiety Symptoms:  Excessive Worry, Psychotic Symptoms:  denies PTSD Symptoms: Negative  Past Psychiatric History:  Outpatient: denies Psychiatry admission: denies Previous suicide attempt: denies  Past trials of medication: fluoxetine, duloxetine, Lyrica History of violence: denies  Previous Psychotropic Medications: Yes   Substance Abuse History in the last 12 months:  No.  Consequences of Substance Abuse: NA  Past Medical History:  Past Medical History:  Diagnosis Date  . Anxiety   . Arthritis   . Depression   . Fibroid   . Fibromyalgia   . Hypercholesterolemia   . Hypertension   . Neuropathy     Past Surgical History:  Procedure Laterality Date  . ABDOMINAL SURGERY    . CHOLECYSTECTOMY N/A 07/20/2014   Procedure: LAPAROSCOPIC CHOLECYSTECTOMY;  Surgeon: Jamesetta So, MD;  Location: [facility];  Service: General;  Laterality: N/A;  . EYE SURGERY Left    45 yrs ago- eye "went out of focus".  Marland Kitchen MYOMECTOMY     Women's- 15 yrs ago    Family Psychiatric History:  Adopted, does not know family history  Family History:  Family History  Problem Relation Age of Onset  . Adopted: Yes  . Liver disease Neg Hx   . Pancreatic disease Neg Hx     Social History:   Social History   Social History  . Marital status: Single    Spouse name: N/A  . Number of children: 0  . Years of  education: N/A   Occupational History  . CNA - [place]    Social History Main Topics  . Smoking status: Never Smoker  . Smokeless tobacco: Never Used  . Alcohol use No  . Drug use: No  . Sexual activity: No   Other Topics Concern  . None   Social History Narrative  . None    Additional Social History:  Education: graduated from high school Work: CNA for 11 years,  She has never been married, no children. She lives with her sister, who she reports great relationship. She grew up in [place],  She was adopted when born, reports good relationship with her adopted parents   Allergies:   Allergies  Allergen Reactions  . Tetracyclines & Related   . Codeine     Causes itchiness    Metabolic Disorder Labs: No results found for: HGBA1C, MPG No results found for: PROLACTIN No results found for: CHOL, TRIG, HDL, CHOLHDL, VLDL, LDLCALC   Current Medications: Current Outpatient Prescriptions  Medication Sig Dispense Refill  . DULoxetine (CYMBALTA) 60 MG capsule Take 60 mg by mouth daily.    . cyclobenzaprine (FLEXERIL) 10 MG tablet Take 10 mg by mouth 3 (three) times daily as needed for muscle spasms.    . diclofenac (VOLTAREN) 50 MG EC tablet Take 1 tablet (50 mg total) by mouth 2 (two) times daily. 15 tablet 0  . lisinopril-hydrochlorothiazide (PRINZIDE,ZESTORETIC) 10-12.5 MG per tablet Take 1 tablet by mouth daily.     No current facility-administered medications for this visit.     Neurologic: Headache: No Seizure: No Paresthesias:Yes  Musculoskeletal: Strength & Muscle Tone: within normal limits Gait & Station: normal Patient leans: N/A  Psychiatric Specialty Exam: Review of Systems  Musculoskeletal: Positive for back pain and myalgias.  Psychiatric/Behavioral: Positive for depression. Negative for hallucinations, substance abuse and suicidal ideas. The patient is nervous/anxious and has insomnia.   All other systems reviewed and are negative.   Blood pressure 124/84, pulse 84, resp. rate 14, height 5\' 2"  (1.575 m), weight 186 lb 9.6 oz (84.6 kg), SpO2 96 %.Body mass index is 34.13 kg/m.  General Appearance: Fairly Groomed  Eye Contact:  Good  Speech:  Clear and Coherent  Volume:  Normal  Mood:  Depressed  Affect:  down, uncomfortable in pain  Thought Process:  Coherent and Goal Directed  Orientation:  Full (Time, Place, and Person)  Thought Content:  Logical Perceptions: denies AH/VH  Suicidal Thoughts:  No  Homicidal Thoughts:  No  Memory:  Immediate;    Good Recent;   Good Remote;   Good  Judgement:  Good  Insight:  Fair  Psychomotor Activity:  Restlessness, pacing due to pain  Concentration:  Concentration: Good and Attention Span: Good  Recall:  Good  Fund of Knowledge:Good  Language: Good  Akathisia:  No  Handed:  Right  AIMS (if indicated):  N/A  Assets:  Communication Skills Desire for Improvement  ADL's:  Intact  Cognition: WNL  Sleep:  poor   Assessment Kristine Young is a 56 year old female with depression, fibromyalgia per self report, peripheral neuropathy, who is referred for depression.   # MDD, moderate, recurrent without psychotic features Patient endorses neurovegetative symptoms in the setting of back pain and financial strain. Given she was started on duloxetine yesterday, will continue the same dose until the next month. She has anticipatory anxiety and will greatly benefit from CBT for depression, anxiety and pain. Will make a referral.  Noted that although she made a comment of SI/HI at PCP visit, she adamantly denies any intent, and explains she made these comments as expression to ask for help. She denies gun access at home.   Plan 1. Continue duloxetine 60 mg daily 2. Return to clinic in one month for 30 mins 3. Referral to therapy  The patient demonstrates the following risk factors for suicide: Chronic risk factors for suicide include: psychiatric disorder of depression, anxiety and chronic pain. Acute risk factors for suicide include: loss (financial, interpersonal, professional). Protective factors for this patient include: positive social support, coping skills and hope for the future. Considering these factors, the overall suicide risk at this point appears to be low. Patient is appropriate for outpatient follow up.   Treatment Plan Summary: Plan as above   Norman Clay, MD 7/11/20184:24 PM

## 2017-04-18 ENCOUNTER — Encounter (INDEPENDENT_AMBULATORY_CARE_PROVIDER_SITE_OTHER): Payer: Self-pay

## 2017-04-18 ENCOUNTER — Encounter (HOSPITAL_COMMUNITY): Payer: Self-pay | Admitting: Psychiatry

## 2017-04-18 ENCOUNTER — Ambulatory Visit (INDEPENDENT_AMBULATORY_CARE_PROVIDER_SITE_OTHER): Payer: BLUE CROSS/BLUE SHIELD | Admitting: Psychiatry

## 2017-04-18 VITALS — BP 124/84 | HR 84 | Resp 14 | Ht 62.0 in | Wt 186.6 lb

## 2017-04-18 DIAGNOSIS — M797 Fibromyalgia: Secondary | ICD-10-CM

## 2017-04-18 DIAGNOSIS — G4700 Insomnia, unspecified: Secondary | ICD-10-CM | POA: Diagnosis not present

## 2017-04-18 DIAGNOSIS — F331 Major depressive disorder, recurrent, moderate: Secondary | ICD-10-CM

## 2017-04-18 DIAGNOSIS — G629 Polyneuropathy, unspecified: Secondary | ICD-10-CM | POA: Diagnosis not present

## 2017-04-18 NOTE — Patient Instructions (Addendum)
1. Continue duloxetine 60 mg daily 2. Return to clinic in one month for 30 mins 3. Referral to therapy

## 2017-05-15 ENCOUNTER — Ambulatory Visit (HOSPITAL_COMMUNITY): Payer: BLUE CROSS/BLUE SHIELD | Admitting: Licensed Clinical Social Worker

## 2017-05-17 ENCOUNTER — Ambulatory Visit (HOSPITAL_COMMUNITY): Payer: BLUE CROSS/BLUE SHIELD | Admitting: Psychiatry

## 2018-10-18 ENCOUNTER — Telehealth: Payer: Self-pay | Admitting: Obstetrics and Gynecology

## 2018-10-18 NOTE — Telephone Encounter (Signed)
Called and left a message for patient to call back to schedule an existing patient doctor referral appointment with our office to see Dr. Talbert Nan for her AEX. She was seen last year on 10/19/16.

## 2018-10-22 NOTE — Telephone Encounter (Signed)
Called and left a message for patient to call back to schedule an existing patient doctor referral appointment with our office to see Dr. Talbert Nan for her AEX. She was seen last year on 10/19/16.

## 2018-11-19 NOTE — Progress Notes (Signed)
58 y.o. G0P0000 Single Black or African American Not Hispanic or Latino female here for annual exam.   No vaginal bleeding. No bowel or bladder changes. She has been diagnosed with Scleroderma in the last year. She would like STD testing. She would like testing for HSV, gets intermittent vulvar sores.  She feels a lump on her vulva, would like it checked.     No LMP recorded. Patient is postmenopausal.          Sexually active: No. Has been in the past.  The current method of family planning is status post menopausal.  Exercising: No.  The patient does not participate in regular exercise at present. Smoker:  no  Health Maintenance: Pap:  10/19/2016 WNL NEG HPV  History of abnormal Pap:  Yes years ago per patient MMG:  05-10-12 Birads 1 negative Colonoscopy:  Never BMD:   Never TDaP:  10/20/2016 Gardasil: N/A   reports that she has never smoked. She has never used smokeless tobacco. She reports that she does not drink alcohol or use drugs. Lives with her twin sister (they are adopted). Works as a Quarry manager.  Past Medical History:  Diagnosis Date  . Anxiety   . Arthritis   . Depression   . Fibroid   . Fibromyalgia   . Hypercholesterolemia   . Hypertension   . Neuropathy   . Scleroderma [facility])     Past Surgical History:  Procedure Laterality Date  . ABDOMINAL SURGERY    . CHOLECYSTECTOMY N/A 07/20/2014   Procedure: LAPAROSCOPIC CHOLECYSTECTOMY;  Surgeon: Jamesetta So, MD;  Location: [facility];  Service: General;  Laterality: N/A;  . EYE SURGERY Left    45 yrs ago- eye "went out of focus".  Marland Kitchen MYOMECTOMY     [facility]- 15 yrs ago    Current Outpatient Medications  Medication Sig Dispense Refill  . cyclobenzaprine (FLEXERIL) 10 MG tablet Take 10 mg by mouth 3 (three) times daily as needed for muscle spasms.    . DULoxetine (CYMBALTA) 60 MG capsule Take 60 mg by mouth daily.    Marland Kitchen lisinopril-hydrochlorothiazide (PRINZIDE,ZESTORETIC) 10-12.5 MG per tablet Take 1 tablet by mouth daily.     . hydroxychloroquine (PLAQUENIL) 200 MG tablet Take 200 mg by mouth 2 (two) times daily.    . predniSONE (DELTASONE) 10 MG tablet Take 10 mg by mouth daily.     No current facility-administered medications for this visit.     Family History  Adopted: Yes  Problem Relation Age of Onset  . Colon cancer Father 68  . Liver disease Neg Hx   . Pancreatic disease Neg Hx     Review of Systems  Constitutional: Negative.   HENT: Negative.   Eyes: Negative.   Respiratory: Negative.   Cardiovascular: Negative.   Gastrointestinal: Negative.   Endocrine: Negative.   Genitourinary: Negative.   Musculoskeletal:       Muscle and joint pain  Skin: Negative.   Allergic/Immunologic: Negative.   Neurological: Negative.   Hematological: Negative.   Psychiatric/Behavioral: Negative.     Exam:   BP 118/72 (BP Location: Right Arm, Patient Position: Sitting, Cuff Size: Large)   Pulse 76   Ht 5' 2.21" (1.58 m)   Wt 201 lb 3.2 oz (91.3 kg)   BMI 36.56 kg/m   Weight change: @WEIGHTCHANGE @ Height:   Height: 5' 2.21" (158 cm)  Ht Readings from Last 3 Encounters:  11/21/18 5' 2.21" (1.58 m)  10/20/16 5\' 2"  (1.575 m)  10/19/16 5' 2.25" (1.581  m)    General appearance: alert, cooperative and appears stated age Head: Normocephalic, without obvious abnormality, atraumatic Neck: no adenopathy, supple, symmetrical, trachea midline and thyroid normal to inspection and palpation Lungs: clear to auscultation bilaterally Cardiovascular: regular rate and rhythm Breasts: normal appearance, no masses or tenderness, bilaterally inverted nipples, no change Abdomen: soft, non-tender; non distended,  no masses,  no organomegaly Extremities: extremities normal, atraumatic, no cyanosis or edema Skin: Skin color, texture, turgor normal. No rashes or lesions Lymph nodes: Cervical, supraclavicular, and axillary nodes normal. No abnormal inguinal nodes palpated Neurologic: Grossly normal   Pelvic: External  genitalia:  There are 2 small white raised lesions on the right perineum.               Urethra:  normal appearing urethra with no masses, tenderness or lesions              Bartholins and Skenes: normal                 Vagina: normal appearing vagina with normal color and discharge, no lesions              Cervix: no lesions and elongated and stenotic, no polps               Bimanual Exam:  Uterus:  normal size, contour, position, consistency, mobility, non-tender              Adnexa: no mass, fullness, tenderness               Rectovaginal: Confirms               Anus:  normal sphincter tone, no lesions  Chaperone was present for exam.  A:  Well Woman with normal exam  FH of colon cancer  Vulvar lesion  P:   Pap next year  Return for vulvar biopsy  Colonoscopy referral placed  Mammogram information given, encouraged to schedule  Labs with primary  Discussed breast self exam  Discussed calcium and vit D intake  Screening for STD

## 2018-11-21 ENCOUNTER — Encounter: Payer: Self-pay | Admitting: Gastroenterology

## 2018-11-21 ENCOUNTER — Ambulatory Visit: Payer: BLUE CROSS/BLUE SHIELD | Admitting: Obstetrics and Gynecology

## 2018-11-21 ENCOUNTER — Other Ambulatory Visit: Payer: Self-pay

## 2018-11-21 ENCOUNTER — Encounter: Payer: Self-pay | Admitting: Obstetrics and Gynecology

## 2018-11-21 VITALS — BP 118/72 | HR 76 | Ht 62.21 in | Wt 201.2 lb

## 2018-11-21 DIAGNOSIS — Z800 Family history of malignant neoplasm of digestive organs: Secondary | ICD-10-CM | POA: Diagnosis not present

## 2018-11-21 DIAGNOSIS — N9089 Other specified noninflammatory disorders of vulva and perineum: Secondary | ICD-10-CM

## 2018-11-21 DIAGNOSIS — Z1211 Encounter for screening for malignant neoplasm of colon: Secondary | ICD-10-CM

## 2018-11-21 DIAGNOSIS — Z113 Encounter for screening for infections with a predominantly sexual mode of transmission: Secondary | ICD-10-CM

## 2018-11-21 DIAGNOSIS — Z01419 Encounter for gynecological examination (general) (routine) without abnormal findings: Secondary | ICD-10-CM

## 2018-11-22 LAB — HSV(HERPES SIMPLEX VRS) I + II AB-IGG: HSV 2 IgG, Type Spec: <0.91 index (ref 0.00–0.90)

## 2018-11-22 LAB — HIV ANTIBODY (ROUTINE TESTING W REFLEX): HIV Screen 4th Generation wRfx: NONREACTIVE

## 2018-11-22 LAB — RPR: RPR: NONREACTIVE

## 2018-11-22 LAB — HEPATITIS C ANTIBODY: Hep C Virus Ab: <0.1 s/co ratio (ref 0.0–0.9)

## 2018-11-23 LAB — CHLAMYDIA/GONOCOCCUS/TRICHOMONAS, NAA
Chlamydia by NAA: NEGATIVE
GONOCOCCUS BY NAA: NEGATIVE
TRICH VAG BY NAA: NEGATIVE

## 2018-11-27 NOTE — Progress Notes (Deleted)
GYNECOLOGY  VISIT   HPI: 58 y.o.   Single Black or African American Not Hispanic or Latino  female   G0P0000 with No LMP recorded. Patient is postmenopausal.   here for     GYNECOLOGIC HISTORY: No LMP recorded. Patient is postmenopausal. Contraception:*** Menopausal hormone therapy: ***        OB History    Gravida  0   Para  0   Term  0   Preterm  0   AB  0   Living  0     SAB  0   TAB  0   Ectopic  0   Multiple  0   Live Births  0              Patient Active Problem List   Diagnosis Date Noted  . Right upper quadrant pain 07/09/2014    Past Medical History:  Diagnosis Date  . Anxiety   . Arthritis   . Depression   . Fibroid   . Fibromyalgia   . Hypercholesterolemia   . Hypertension   . Neuropathy   . Scleroderma [facility])     Past Surgical History:  Procedure Laterality Date  . ABDOMINAL SURGERY    . CHOLECYSTECTOMY N/A 07/20/2014   Procedure: LAPAROSCOPIC CHOLECYSTECTOMY;  Surgeon: Jamesetta So, MD;  Location: [facility];  Service: General;  Laterality: N/A;  . EYE SURGERY Left    45 yrs ago- eye "went out of focus".  Marland Kitchen MYOMECTOMY     Women[facility]- 15 yrs ago    Current Outpatient Medications  Medication Sig Dispense Refill  . cyclobenzaprine (FLEXERIL) 10 MG tablet Take 10 mg by mouth 3 (three) times daily as needed for muscle spasms.    . DULoxetine (CYMBALTA) 60 MG capsule Take 60 mg by mouth daily.    . hydroxychloroquine (PLAQUENIL) 200 MG tablet Take 200 mg by mouth 2 (two) times daily.    Marland Kitchen lisinopril-hydrochlorothiazide (PRINZIDE,ZESTORETIC) 10-12.5 MG per tablet Take 1 tablet by mouth daily.    . predniSONE (DELTASONE) 10 MG tablet Take 10 mg by mouth daily.     No current facility-administered medications for this visit.      ALLERGIES: Tetracyclines & related and Codeine  Family History  Adopted: Yes  Problem Relation Age of Onset  . Colon cancer Father 26  . Liver disease Neg Hx   . Pancreatic disease Neg Hx     Social  History   Socioeconomic History  . Marital status: Single    Spouse name: Not on file  . Number of children: 0  . Years of education: Not on file  . Highest education level: Not on file  Occupational History  . 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 Financial resource strain: Not on file  . Food insecurity:    Worry: Not on file    Inability: Not on file  . Transportation needs:    Medical: Not on file    Non-medical: Not on file  Tobacco Use  . Smoking status: Never Smoker  . Smokeless tobacco: Never Used  Substance and Sexual Activity  . Alcohol use: No  . Drug use: No  . Sexual activity: Not Currently    Partners: Male    Birth control/protection: Post-menopausal  Lifestyle  . Physical activity:    Days per week: Not on file    Minutes per session: Not on file  . Stress: Not on  file  Relationships  . Social connections:    Talks on phone: Not on file    Gets together: Not on file    Attends religious service: Not on file    Active member of club or organization: Not on file    Attends meetings of clubs or organizations: Not on file    Relationship status: Not on file  . Intimate partner violence:    Fear of current or ex partner: Not on file    Emotionally abused: Not on file    Physically abused: Not on file    Forced sexual activity: Not on file  Other Topics Concern  . Not on file  Social History Narrative  . Not on file    ROS  PHYSICAL EXAMINATION:    There were no vitals taken for this visit.    General appearance: alert, cooperative and appears stated age Neck: no adenopathy, supple, symmetrical, trachea midline and thyroid {CHL AMB PHY EX THYROID NORM DEFAULT:[phone]::"normal to inspection and palpation"} Breasts: {Exam; breast:13139::"normal appearance, no masses or tenderness"} Abdomen: soft, non-tender; non distended, no masses,  no organomegaly  Pelvic: External genitalia:  no lesions               Urethra:  normal appearing urethra with no masses, tenderness or lesions              Bartholins and Skenes: normal                 Vagina: normal appearing vagina with normal color and discharge, no lesions              Cervix: {CHL AMB PHY EX CERVIX NORM [phone]::"no lesions"}              Bimanual Exam:  Uterus:  {CHL AMB PHY EX UTERUS NORM DEFAULT:[phone]::"normal size, contour, position, consistency, mobility, non-tender"}              Adnexa: {CHL AMB PHY EX ADNEXA NO MASS DEFAULT:[phone]::"no mass, fullness, tenderness"}              Rectovaginal: {yes no:[id number]}.  Confirms.              Anus:  normal sphincter tone, no lesions  Chaperone was present for exam.  ASSESSMENT     PLAN    An After Visit Summary was printed and given to the patient.  *** minutes face to face time of which over 50% was spent in counseling.

## 2018-12-02 NOTE — Progress Notes (Signed)
GYNECOLOGY  VISIT   HPI: 58 y.o.   Single Black or African American Not Hispanic or Latino  female   G0P0000 with No LMP recorded. Patient is postmenopausal.   here for vulvar biopsy.  She c/o a lump at her annual exam and had a raised white lesion.   GYNECOLOGIC HISTORY: No LMP recorded. Patient is postmenopausal. Contraception: Postmenopausal Menopausal hormone therapy: None        OB History    Gravida  0   Para  0   Term  0   Preterm  0   AB  0   Living  0     SAB  0   TAB  0   Ectopic  0   Multiple  0   Live Births  0              Patient Active Problem List   Diagnosis Date Noted  . Right upper quadrant pain 07/09/2014    Past Medical History:  Diagnosis Date  . Anxiety   . Arthritis   . Depression   . Fibroid   . Fibromyalgia   . Hypercholesterolemia   . Hypertension   . Neuropathy   . Scleroderma [facility])     Past Surgical History:  Procedure Laterality Date  . ABDOMINAL SURGERY    . CHOLECYSTECTOMY N/A 07/20/2014   Procedure: LAPAROSCOPIC CHOLECYSTECTOMY;  Surgeon: Jamesetta So, MD;  Location: [facility];  Service: General;  Laterality: N/A;  . EYE SURGERY Left    45 yrs ago- eye "went out of focus".  Marland Kitchen MYOMECTOMY     Women[facility]- 15 yrs ago    Current Outpatient Medications  Medication Sig Dispense Refill  . cyclobenzaprine (FLEXERIL) 10 MG tablet Take 10 mg by mouth 3 (three) times daily as needed for muscle spasms.    . DULoxetine (CYMBALTA) 60 MG capsule Take 60 mg by mouth daily.    . hydroxychloroquine (PLAQUENIL) 200 MG tablet Take 200 mg by mouth 2 (two) times daily.    Marland Kitchen lisinopril-hydrochlorothiazide (PRINZIDE,ZESTORETIC) 10-12.5 MG per tablet Take 1 tablet by mouth daily.    . predniSONE (DELTASONE) 10 MG tablet Take 10 mg by mouth daily.     No current facility-administered medications for this visit.      ALLERGIES: Tetracyclines & related and Codeine  Family History  Adopted: Yes  Problem Relation Age of Onset  .  Colon cancer Father 52  . Liver disease Neg Hx   . Pancreatic disease Neg Hx     Social History   Socioeconomic History  . Marital status: Single    Spouse name: Not on file  . Number of children: 0  . Years of education: Not on file  . Highest education level: Not on file  Occupational History  . 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 Financial resource strain: Not on file  . Food insecurity:    Worry: Not on file    Inability: Not on file  . Transportation needs:    Medical: Not on file    Non-medical: Not on file  Tobacco Use  . Smoking status: Never Smoker  . Smokeless tobacco: Never Used  Substance and Sexual Activity  . Alcohol use: No  . Drug use: No  . Sexual activity: Not Currently    Partners: Male    Birth control/protection: Post-menopausal  Lifestyle  . Physical activity:    Days per week: Not  on file    Minutes per session: Not on file  . Stress: Not on file  Relationships  . Social connections:    Talks on phone: Not on file    Gets together: Not on file    Attends religious service: Not on file    Active member of club or organization: Not on file    Attends meetings of clubs or organizations: Not on file    Relationship status: Not on file  . Intimate partner violence:    Fear of current or ex partner: Not on file    Emotionally abused: Not on file    Physically abused: Not on file    Forced sexual activity: Not on file  Other Topics Concern  . Not on file  Social History Narrative  . Not on file    Review of Systems  Constitutional: Negative.   HENT: Negative.   Eyes: Negative.   Respiratory: Negative.   Cardiovascular: Negative.   Gastrointestinal: Negative.   Genitourinary: Negative.   Musculoskeletal: Negative.   Skin: Negative.   Neurological: Negative.   Endo/Heme/Allergies: Negative.   Psychiatric/Behavioral: Negative.     PHYSICAL EXAMINATION:    BP 118/68 (BP Location: Right  Arm, Patient Position: Sitting, Cuff Size: Large)   Pulse 72   Wt 201 lb 3.2 oz (91.3 kg)   BMI 36.56 kg/m     General appearance: alert, cooperative and appears stated age  Pelvic: External genitalia:  Raised white lesion on her perineum.  The risks of the procedure were reviewed with the patient and a consent was signed. The area was cleansed with betadine and injected with 1% lidocaine. The lesion was  Remove with a #11 blade. The defect was closed with 4-0 vicryl. The patient tolerated the procedure well.               Chaperone was present for exam.  ASSESSMENT Vulvar lesion    PLAN Biopsy done   An After Visit Summary was printed and given to the patient.

## 2018-12-03 ENCOUNTER — Encounter: Payer: Self-pay | Admitting: Obstetrics and Gynecology

## 2018-12-03 ENCOUNTER — Other Ambulatory Visit: Payer: Self-pay

## 2018-12-03 ENCOUNTER — Ambulatory Visit: Payer: BLUE CROSS/BLUE SHIELD | Admitting: Obstetrics and Gynecology

## 2018-12-03 DIAGNOSIS — N9089 Other specified noninflammatory disorders of vulva and perineum: Secondary | ICD-10-CM

## 2018-12-03 NOTE — Patient Instructions (Signed)

## 2018-12-12 ENCOUNTER — Ambulatory Visit (AMBULATORY_SURGERY_CENTER): Payer: Self-pay

## 2018-12-12 VITALS — Ht 62.5 in | Wt 201.6 lb

## 2018-12-12 DIAGNOSIS — Z800 Family history of malignant neoplasm of digestive organs: Secondary | ICD-10-CM

## 2018-12-12 MED ORDER — PEG-KCL-NACL-NASULF-NA ASC-C 140 G PO SOLR: 1.0000 | Freq: Once | ORAL | Status: AC

## 2018-12-12 NOTE — Progress Notes (Signed)
Per pt, no allergies to soy or egg products.Pt not taking any weight loss meds or using  O2 at home.  Pt refused emmi video. 

## 2018-12-24 ENCOUNTER — Encounter: Payer: Self-pay | Admitting: Gastroenterology

## 2019-01-08 ENCOUNTER — Encounter: Payer: Self-pay | Admitting: Gastroenterology

## 2019-01-09 ENCOUNTER — Telehealth: Payer: Self-pay | Admitting: *Deleted

## 2019-01-09 NOTE — Telephone Encounter (Signed)
Spoke with patient.  She is aware procedure is cancelled and we will call her to reschedule.

## 2019-01-09 NOTE — Telephone Encounter (Signed)
LM on VM for patient.  Informed her that her appointment on 4/15 is cancelled d/t covid 19 restrictions.  Asked her to call back to confirm.

## 2019-01-22 ENCOUNTER — Encounter: Payer: Self-pay | Admitting: Gastroenterology

## 2019-04-08 ENCOUNTER — Encounter: Payer: Self-pay | Admitting: Gastroenterology

## 2019-11-25 NOTE — Progress Notes (Signed)
60 y.o. G0P0000 Single Black or African American Not Hispanic or Latino female here for annual exam. Patient states that the place where her right vulvar lesion (condyloma) was removed last year will sometimes burn when she urinates.  No vaginal bleeding. Not sexually active in the last year.     No LMP recorded. Patient is postmenopausal.          Sexually active: No.  The current method of family planning is post menopausal status.    Exercising: No.  The patient does not participate in regular exercise at present. Smoker:  no  Health Maintenance: Pap: 10/19/2016 WNL NEG HPV History of abnormal Pap:  Yes years ago, no surgery on her cervix.   MMG:  05-10-12 Birads 1 negative. Patient states she has had a mammogram since then, but not sure when or where. Not in care everywhere.   BMD:   2018 per patient with Dr. Kern Alberta (Rheumatologist), normal per patient Colonoscopy: never  TDaP:  10/20/16 Gardasil: NA   reports that she has never smoked. She has never used smokeless tobacco. She reports that she does not drink alcohol or use drugs. Lives with her twin sister (they are adopted). Works as a Quarry manager.  Past Medical History:  Diagnosis Date  . Anxiety   . Arthritis   . Depression   . Fibroid   . Fibromyalgia   . Hematoma    left buttocks due to fall/ from 2 years ago (2018)  . Hypercholesterolemia   . Hypertension   . Neuropathy   . Scleroderma ([place])   . Sleep apnea     Past Surgical History:  Procedure Laterality Date  . ABDOMINAL SURGERY  1997  . CHOLECYSTECTOMY N/A 07/20/2014   Procedure: LAPAROSCOPIC CHOLECYSTECTOMY;  Surgeon: Jamesetta So, MD;  Location: [facility];  Service: General;  Laterality: N/A;  . EYE SURGERY Left    45 yrs ago- eye "went out of focus".  Marland Kitchen MYOMECTOMY     Women[facility]- 15 yrs ago    Current Outpatient Medications  Medication Sig Dispense Refill  . cyclobenzaprine (FLEXERIL) 10 MG tablet Take 10 mg by mouth 3 (three) times daily as needed for muscle  spasms.    . DULoxetine (CYMBALTA) 60 MG capsule Take 60 mg by mouth daily.    . hydroxychloroquine (PLAQUENIL) 200 MG tablet Take 200 mg by mouth 2 (two) times daily.    . hydrOXYzine (ATARAX/VISTARIL) 50 MG tablet Take 50 mg by mouth 3 (three) times daily as needed.    Marland Kitchen lisinopril-hydrochlorothiazide (PRINZIDE,ZESTORETIC) 10-12.5 MG per tablet Take 1 tablet by mouth daily.    . predniSONE (DELTASONE) 10 MG tablet Take 10 mg by mouth daily.     No current facility-administered medications for this visit.    Family History  Adopted: Yes  Problem Relation Age of Onset  . Colon cancer Father 54  . Alzheimer's disease Father   . Liver disease Neg Hx   . Pancreatic disease Neg Hx     Review of Systems  Genitourinary: Positive for vaginal pain.  All other systems reviewed and are negative.   Exam:   There were no vitals taken for this visit.  Weight change: @WEIGHTCHANGE @ Height:      Ht Readings from Last 3 Encounters:  12/12/18 5' 2.5" (1.588 m)  11/21/18 5' 2.21" (1.58 m)  10/20/16 5\' 2"  (1.575 m)    General appearance: alert, cooperative and appears stated age Head: Normocephalic, without obvious abnormality, atraumatic Neck: no adenopathy, supple, symmetrical,  trachea midline and thyroid normal to inspection and palpation Lungs: clear to auscultation bilaterally Cardiovascular: regular rate and rhythm Breasts: normal appearance, no masses or tenderness Abdomen: soft, non-tender; non distended,  no masses,  no organomegaly Extremities: extremities normal, atraumatic, no cyanosis or edema Skin: Skin color, texture, turgor normal. No rashes or lesions Lymph nodes: Cervical, supraclavicular, and axillary nodes normal. No abnormal inguinal nodes palpated Neurologic: Grossly normal   Pelvic: External genitalia:  On the right side of the perineum is a white, irregularly shaped, raised lesion, ~5-6 mm (area of discomfort). There is a skin tag on the left side of the perineum.               Urethra:  normal appearing urethra with no masses, tenderness or lesions              Bartholins and Skenes: normal                 Vagina: normal appearing vagina with normal color and discharge, no lesions              Cervix: no lesions               Bimanual Exam:  Uterus:  no masses or tenderness              Adnexa: no mass, fullness, tenderness               Rectovaginal: Confirms               Anus:  normal sphincter tone, no lesions  [name] chaperoned for the exam.  A:  Well Woman with normal exam  FH of colon cancer  Vulvar irritation, burns intermittently when she voids for the last year (since biopsy)  P:   No pap this year  Colonoscopy referral placed   Mammogram overdue, # given to schedule  Discussed breast self exam  Discussed calcium and vit D intake  Return for vulvar biopsy

## 2019-11-26 ENCOUNTER — Ambulatory Visit: Payer: 59 | Admitting: Obstetrics and Gynecology

## 2019-11-26 ENCOUNTER — Encounter: Payer: Self-pay | Admitting: Obstetrics and Gynecology

## 2019-11-26 ENCOUNTER — Other Ambulatory Visit: Payer: Self-pay

## 2019-11-26 VITALS — BP 124/68 | HR 97 | Temp 98.7°F | Ht 63.0 in | Wt 208.0 lb

## 2019-11-26 DIAGNOSIS — N9089 Other specified noninflammatory disorders of vulva and perineum: Secondary | ICD-10-CM

## 2019-11-26 DIAGNOSIS — Z800 Family history of malignant neoplasm of digestive organs: Secondary | ICD-10-CM | POA: Diagnosis not present

## 2019-11-26 DIAGNOSIS — Z1211 Encounter for screening for malignant neoplasm of colon: Secondary | ICD-10-CM

## 2019-11-26 DIAGNOSIS — Z01419 Encounter for gynecological examination (general) (routine) without abnormal findings: Secondary | ICD-10-CM

## 2019-11-26 NOTE — Patient Instructions (Signed)

## 2019-12-02 ENCOUNTER — Telehealth: Payer: Self-pay | Admitting: Obstetrics and Gynecology

## 2019-12-02 NOTE — Telephone Encounter (Signed)
Call placed to convey benefits for vulva biopsy. Spoke with the patient and conveyed the benefits. Patient understands/agreeable with the benefits. Appointment scheduled 12/09/19@10am  with Dr. Talbert Nan.

## 2019-12-09 ENCOUNTER — Other Ambulatory Visit: Payer: Self-pay

## 2019-12-09 ENCOUNTER — Ambulatory Visit: Payer: 59 | Attending: Internal Medicine

## 2019-12-09 ENCOUNTER — Encounter: Payer: Self-pay | Admitting: Obstetrics and Gynecology

## 2019-12-09 ENCOUNTER — Other Ambulatory Visit (HOSPITAL_COMMUNITY)
Admission: RE | Admit: 2019-12-09 | Discharge: 2019-12-09 | Disposition: A | Discharge status: Home / Self Care | Payer: 59 | Source: Ambulatory Visit | Attending: Obstetrics and Gynecology | Admitting: Obstetrics and Gynecology

## 2019-12-09 ENCOUNTER — Ambulatory Visit (INDEPENDENT_AMBULATORY_CARE_PROVIDER_SITE_OTHER): Payer: 59 | Admitting: Obstetrics and Gynecology

## 2019-12-09 DIAGNOSIS — N9089 Other specified noninflammatory disorders of vulva and perineum: Secondary | ICD-10-CM

## 2019-12-09 DIAGNOSIS — Z23 Encounter for immunization: Secondary | ICD-10-CM | POA: Insufficient documentation

## 2019-12-09 NOTE — Addendum Note (Signed)
Addended by: Dorothy Spark on: 12/09/2019 11:02 AM   Modules accepted: Orders

## 2019-12-09 NOTE — Patient Instructions (Signed)

## 2019-12-09 NOTE — Progress Notes (Signed)
GYNECOLOGY  VISIT   HPI: 59 y.o.   Single Black or African American Not Hispanic or Latino  female   [id number] with No LMP recorded. Patient is postmenopausal.   here for  Vulvar biopsy She states that she is still having the itching.   GYNECOLOGIC HISTORY: No LMP recorded. Patient is postmenopausal. Contraception:none  Menopausal hormone therapy: none         OB History    Gravida  0   Para  0   Term  0   Preterm  0   AB  0   Living  0     SAB  0   TAB  0   Ectopic  0   Multiple  0   Live Births  0              Patient Active Problem List   Diagnosis Date Noted  . Right upper quadrant pain 07/09/2014    Past Medical History:  Diagnosis Date  . Anxiety   . Arthritis   . Cataract   . Depression   . Fibroid   . Fibromyalgia   . Hematoma    left buttocks due to fall/ from 2 years ago (2018)  . Hypercholesterolemia   . Hypertension   . Neuropathy   . Scleroderma ([place])   . Sleep apnea     Past Surgical History:  Procedure Laterality Date  . ABDOMINAL SURGERY  1997  . CHOLECYSTECTOMY N/A 07/20/2014   Procedure: LAPAROSCOPIC CHOLECYSTECTOMY;  Surgeon: Jamesetta So, MD;  Location: [facility];  Service: General;  Laterality: N/A;  . EYE SURGERY Left    45 yrs ago- eye "went out of focus".  Marland Kitchen MYOMECTOMY     Women[facility]- 15 yrs ago    Current Outpatient Medications  Medication Sig Dispense Refill  . cyclobenzaprine (FLEXERIL) 10 MG tablet Take 10 mg by mouth 3 (three) times daily as needed for muscle spasms.    . DULoxetine (CYMBALTA) 60 MG capsule Take 60 mg by mouth daily.    . hydroxychloroquine (PLAQUENIL) 200 MG tablet Take 200 mg by mouth 2 (two) times daily.    . hydrOXYzine (ATARAX/VISTARIL) 50 MG tablet Take 50 mg by mouth 3 (three) times daily as needed.    Marland Kitchen lisinopril-hydrochlorothiazide (PRINZIDE,ZESTORETIC) 10-12.5 MG per tablet Take 1 tablet by mouth daily.    . predniSONE (DELTASONE) 10 MG tablet Take 10 mg by mouth daily.     No current  facility-administered medications for this visit.     ALLERGIES: Codeine and Tetracyclines & related  Family History  Adopted: Yes  Problem Relation Age of Onset  . Colon cancer Father 66  . Alzheimer's disease Father   . Liver disease Neg Hx   . Pancreatic disease Neg Hx     Social History   Socioeconomic History  . Marital status: Single    Spouse name: Not on file  . Number of children: 0  . Years of education: Not on file  . Highest education level: Not on file  Occupational History  . 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 Smoking status: Never Smoker  . Smokeless tobacco: Never Used  Substance and Sexual Activity  . Alcohol use: No  . Drug use: No  . Sexual activity: Not Currently    Partners: Male    Birth control/protection: Post-menopausal  Other Topics Concern  . Not on file  Social History Narrative  .  Not on file   Social Determinants of Health   Financial Resource Strain:   . Difficulty of Paying Living Expenses: Not on file  Food Insecurity:   . Worried About Charity fundraiser in the Last Year: Not on file  . Ran Out of Food in the Last Year: Not on file  Transportation Needs:   . Lack of Transportation (Medical): Not on file  . Lack of Transportation (Non-Medical): Not on file  Physical Activity:   . Days of Exercise per Week: Not on file  . Minutes of Exercise per Session: Not on file  Stress:   . Feeling of Stress : Not on file  Social Connections:   . Frequency of Communication with Friends and Family: Not on file  . Frequency of Social Gatherings with Friends and Family: Not on file  . Attends Religious Services: Not on file  . Active Member of Clubs or Organizations: Not on file  . Attends Archivist Meetings: Not on file  . Marital Status: Not on file  Intimate Partner Violence:   . Fear of Current or Ex-Partner: Not on file  . Emotionally Abused: Not on file  . Physically Abused:  Not on file  . Sexually Abused: Not on file    Review of Systems  All other systems reviewed and are negative.   PHYSICAL EXAMINATION:    There were no vitals taken for this visit.    General appearance: alert, cooperative and appears stated age  Pelvic: External genitalia:  6 mm raised, white lesion on the right perineum.              Urethra:  normal appearing urethra with no masses, tenderness or lesions              Bartholins and Skenes: normal                 The risks of the procedure were reviewed with the patient and a consent was signed. The area was cleansed with betadine and injected with 1% lidocaine. A #11 blade was used to remove the lesion. The defect was closed with 3 simple stitches of 4-0 vicryl. The patient tolerated the procedure well.   Chaperone was present for exam.  ASSESSMENT Vulvar lesion    PLAN Lesion removed Further plans depending on results.    An After Visit Summary was printed and given to the patient.

## 2019-12-09 NOTE — Progress Notes (Signed)
   Covid-19 Vaccination Clinic  Name:  Kristine Young    MRN: [id number] DOB: January 28, 1961  12/09/2019  [name] was observed post Covid-19 immunization for 15 minutes without incident. She was provided with Vaccine Information Sheet and instruction to access the V-Safe system.   [name] was instructed to call 911 with any severe reactions post vaccine: Marland Kitchen Difficulty breathing  . Swelling of face and throat  . A fast heartbeat  . A bad rash all over body  . Dizziness and weakness   Immunizations Administered    Name Date Dose VIS Date Route   Moderna COVID-19 Vaccine 12/09/2019  3:25 PM 0.5 mL 09/09/2019 Intramuscular   Manufacturer: Moderna   Lot: [id number]   [place] [id number]

## 2019-12-15 LAB — SURGICAL PATHOLOGY

## 2019-12-17 ENCOUNTER — Telehealth: Payer: Self-pay | Admitting: Obstetrics and Gynecology

## 2019-12-17 NOTE — Telephone Encounter (Signed)
Spoke to pt after returning call. Pt wanting to know if results of vulvar bx a genital wart and if she has something else to watch out for in the future? Will review with Dr Talbert Nan and return call to pt. Pt agreeable. Can leave detailed message/answer on voicemail per pt.   Routing to Dr Talbert Nan for review and advice.    Salvadore Dom, MD  12/15/2019 1:11 PM EST    Please inform the patient that her vulvar biopsy returned as a wart, benign. No further treatment needed.

## 2019-12-17 NOTE — Telephone Encounter (Signed)
Spoke back with pt after speaking with Dr Talbert Nan. Pt informed of regular wart, not gential (HSV). Pt thankful for call back.   Routing to Dr Talbert Nan for review and will close encounter.

## 2019-12-17 NOTE — Telephone Encounter (Signed)
Left message for pt to call back to Benoit Meech, RN in triage. 

## 2019-12-17 NOTE — Telephone Encounter (Signed)
Patient has a question for Dr.Jertosn, no details given.

## 2020-01-06 ENCOUNTER — Ambulatory Visit: Payer: 59 | Attending: Internal Medicine

## 2020-01-06 DIAGNOSIS — Z23 Encounter for immunization: Secondary | ICD-10-CM

## 2020-01-06 NOTE — Progress Notes (Signed)
   Covid-19 Vaccination Clinic  Name:  Kristine Young    MRN: [id number] DOB: [birth year]  01/06/2020  [name] was observed post Covid-19 immunization for 15 minutes without incident. She was provided with Vaccine Information Sheet and instruction to access the V-Safe system.   [name] was instructed to call 911 with any severe reactions post vaccine: Marland Kitchen Difficulty breathing  . Swelling of face and throat  . A fast heartbeat  . A bad rash all over body  . Dizziness and weakness   Immunizations Administered    Name Date Dose VIS Date Route   Moderna COVID-19 Vaccine 01/06/2020  3:08 PM 0.5 mL 09/09/2019 Intramuscular   Manufacturer: Moderna   Lot: [id number]   [place] [id number]

## 2020-03-02 NOTE — H&P (Signed)
Surgical History & Physical  Patient Name: Kristine Young DOB: 11-Jun-1961  Surgery: Cataract extraction with intraocular lens implant phacoemulsification; Right Eye  Surgeon: Baruch Goldmann MD Surgery Date:  03/22/2020 Pre-Op Date:  03/02/2020  HPI: A 66 Yr. old female patient is referred by Dr Jorja Loa for cataract eval. 1. 1. The patient complains of difficulty when driving, which began many years ago. Both eyes are affected. The episode is gradual. The condition's severity increased since last visit. Symptoms occur when the patient is driving, inside and outside.Pt states she does not drive at night due to blurry [facility]. This is negatively affecting the patient's quality of life. HPI Completed by Dr. Baruch Goldmann  Medical History: Lazy eye OS High Blood Pressure LDL Scleroderma, Fibromyalgia, Neuropathy  Review of Systems Negative Allergic/Immunologic Negative Cardiovascular Negative Constitutional Negative Ear, Nose, Mouth & Throat Negative Endocrine Negative Eyes Negative Gastrointestinal Negative Genitourinary Negative Hemotologic/Lymphatic Negative Integumentary Negative Musculoskeletal Negative Neurological Negative Psychiatry Negative Respiratory  Social   Never smoked   Medication Plaquenil, Prednisone, Cymbalta, Lisinopril-HCTZ,   Sx/Procedures Lazy Eye Sx,  Gallbladder Sx,   Drug Allergies  Codeine,   History & Physical: Heent:  Cataract, Right eye NECK: supple without bruits LUNGS: lungs clear to auscultation CV: regular rate and rhythm Abdomen: soft and non-tender  Impression & Plan: Assessment: 1.  COMBINED FORMS AGE RELATED CATARACT; Both Eyes (H25.813) 2.  ASTIGMATISM REGULAR/Order Refractive Surgery; Both Eyes (H52.223) 3.  BLINDNESS LEFT EYE CATEGORY 4, NORMAL VISION RIGHT EYE (H54.42A4)  Plan: 1.  Cataract accounts for the patient's decreased vision. This visual impairment is not correctable with a tolerable change in glasses or contact  lenses. Cataract surgery with an implantation of a new lens should significantly improve the visual and functional status of the patient. Discussed all risks, benefits, alternatives, and potential complications. Discussed the procedures and recovery. Patient desires to have surgery. A-scan ordered and performed today for intra-ocular lens calculations. The surgery will be performed in order to improve vision for driving, reading, and for eye examinations. Recommend phacoemulsification with intra-ocular lens. Right Eye worse - first. Dilates well - shugarcaine by protocol. Toric Lens. Dense cortical - Vision blue. 2.  Consider toric lens OD (not OS) 3.  Monocular precautions discussed, including wearing shatterproof lenses.

## 2020-03-17 NOTE — Patient Instructions (Signed)
Kristine Young  03/17/2020     @PREFPERIOPPHARMACY @   Your procedure is scheduled on  03/22/2020 .  Report to [name] at  [phone]  A.M.  Call this number if you have problems the morning of surgery:  [phone]   Remember:  Do not eat or drink after midnight.                       Take these medicines the morning of surgery with A SIP OF WATER Flexeril(if needed), cymbalta, hydroxyzine, prednisone.    Do not wear jewelry, make-up or nail polish.  Do not wear lotions, powders, or perfumes. Please wear deodorant and brush your teeth.  Do not shave 48 hours prior to surgery.  Men may shave face and neck.  Do not bring valuables to the hospital.  [facility] is not responsible for any belongings or valuables.  Contacts, dentures or bridgework may not be worn into surgery.  Leave your suitcase in the car.  After surgery it may be brought to your room.  For patients admitted to the hospital, discharge time will be determined by your treatment team.  Patients discharged the day of surgery will not be allowed to drive home.   Name and phone number of your driver:   family Special instructions:  DO NOT smoke the morning of your procedure.  Please read over the following fact sheets that you were given. Anesthesia Post-op Instructions and Care and Recovery After Surgery       Cataract Surgery, Care After This sheet gives you information about how to care for yourself after your procedure. Your health care provider may also give you more specific instructions. If you have problems or questions, contact your health care provider. What can I expect after the procedure? After the procedure, it is common to have:  Itching.  Discomfort.  Fluid discharge.  Sensitivity to light and to touch.  Bruising in or around the eye.  Mild blurred vision. Follow these instructions at home: Eye care   Do not touch or rub your eyes.  Protect your eyes as told by your health  care provider. You may be told to wear a protective eye shield or sunglasses.  Do not put a contact lens into the affected eye or eyes until your health care provider approves.  Keep the area around your eye clean and dry: ? Avoid swimming. ? Do not allow water to hit you directly in the face while showering. ? Keep soap and shampoo out of your eyes.  Check your eye every day for signs of infection. Watch for: ? Redness, swelling, or pain. ? Fluid, blood, or pus. ? Warmth. ? A bad smell. ? Vision that is getting worse. ? Sensitivity that is getting worse. Activity  Do not drive for 24 hours if you were given a sedative during your procedure.  Avoid strenuous activities, such as playing contact sports, for as long as told by your health care provider.  Do not drive or use heavy machinery until your health care provider approves.  Do not bend or lift heavy objects. Bending increases pressure in the eye. You can walk, climb stairs, and do light household chores.  Ask your health care provider when you can return to work. If you work in a dusty environment, you may be advised to wear protective eyewear for a period of time. General instructions  Take or apply over-the-counter and prescription  medicines only as told by your health care provider. This includes eye drops.  Keep all follow-up visits as told by your health care provider. This is important. Contact a health care provider if:  You have increased bruising around your eye.  You have pain that is not helped with medicine.  You have a fever.  You have redness, swelling, or pain in your eye.  You have fluid, blood, or pus coming from your incision.  Your vision gets worse.  Your sensitivity to light gets worse. Get help right away if:  You have sudden loss of vision.  You see flashes of light or spots (floaters).  You have severe eye pain.  You develop nausea or vomiting. Summary  After your procedure, it is  common to have itching, discomfort, bruising, fluid discharge, or sensitivity to light.  Follow instructions from your health care provider about caring for your eye after the procedure.  Do not rub your eye after the procedure. You may need to wear eye protection or sunglasses. Do not wear contact lenses. Keep the area around your eye clean and dry.  Avoid activities that require a lot of effort. These include playing sports and lifting heavy objects.  Contact a health care provider if you have increased bruising, pain that does not go away, or a fever. Get help right away if you suddenly lose your vision, see flashes of light or spots, or have severe pain in the eye. This information is not intended to replace advice given to you by your health care provider. Make sure you discuss any questions you have with your health care provider. Document Revised: 07/22/2019 Document Reviewed: 03/25/2018 Elsevier Patient Education  2020 [place] These instructions provide you with information about caring for yourself after your procedure. Your health care provider may also give you more specific instructions. Your treatment has been planned according to current medical practices, but problems sometimes occur. Call your health care provider if you have any problems or questions after your procedure. What can I expect after the procedure? After your procedure, you may:  Feel sleepy for several hours.  Feel clumsy and have poor balance for several hours.  Feel forgetful about what happened after the procedure.  Have poor judgment for several hours.  Feel nauseous or vomit.  Have a sore throat if you had a breathing tube during the procedure. Follow these instructions at home: For at least 24 hours after the procedure:      Have a responsible adult stay with you. It is important to have someone help care for you until you are awake and alert.  Rest  as needed.  Do not: ? Participate in activities in which you could fall or become injured. ? Drive. ? Use heavy machinery. ? Drink alcohol. ? Take sleeping pills or medicines that cause drowsiness. ? Make important decisions or sign legal documents. ? Take care of children on your own. Eating and drinking  Follow the diet that is recommended by your health care provider.  If you vomit, drink water, juice, or soup when you can drink without vomiting.  Make sure you have little or no nausea before eating solid foods. General instructions  Take over-the-counter and prescription medicines only as told by your health care provider.  If you have sleep apnea, surgery and certain medicines can increase your risk for breathing problems. Follow instructions from your health care provider about wearing your sleep device: ? Anytime you  are sleeping, including during daytime naps. ? While taking prescription pain medicines, sleeping medicines, or medicines that make you drowsy.  If you smoke, do not smoke without supervision.  Keep all follow-up visits as told by your health care provider. This is important. Contact a health care provider if:  You keep feeling nauseous or you keep vomiting.  You feel light-headed.  You develop a rash.  You have a fever. Get help right away if:  You have trouble breathing. Summary  For several hours after your procedure, you may feel sleepy and have poor judgment.  Have a responsible adult stay with you for at least 24 hours or until you are awake and alert. This information is not intended to replace advice given to you by your health care provider. Make sure you discuss any questions you have with your health care provider. Document Revised: 12/24/2017 Document Reviewed: 01/16/2016 Elsevier Patient Education  .

## 2020-03-19 ENCOUNTER — Encounter (HOSPITAL_COMMUNITY): Admission: RE | Admit: 2020-03-19 | Payer: 59 | Source: Ambulatory Visit

## 2020-03-19 ENCOUNTER — Encounter (HOSPITAL_COMMUNITY)
Admission: RE | Admit: 2020-03-19 | Discharge: 2020-03-19 | Disposition: A | Discharge status: Home / Self Care | Payer: 59 | Source: Ambulatory Visit | Attending: Ophthalmology | Admitting: Ophthalmology

## 2020-03-19 ENCOUNTER — Other Ambulatory Visit: Payer: Self-pay

## 2020-03-19 ENCOUNTER — Other Ambulatory Visit (HOSPITAL_COMMUNITY)
Admission: RE | Admit: 2020-03-19 | Discharge: 2020-03-19 | Disposition: A | Discharge status: Home / Self Care | Payer: 59 | Source: Ambulatory Visit | Attending: Ophthalmology | Admitting: Ophthalmology

## 2020-03-19 ENCOUNTER — Encounter (HOSPITAL_COMMUNITY): Payer: Self-pay

## 2020-03-19 DIAGNOSIS — Z20822 Contact with and (suspected) exposure to covid-19: Secondary | ICD-10-CM | POA: Insufficient documentation

## 2020-03-19 DIAGNOSIS — Z01812 Encounter for preprocedural laboratory examination: Secondary | ICD-10-CM | POA: Insufficient documentation

## 2020-03-19 LAB — BASIC METABOLIC PANEL
Anion gap: 12 (ref 5–15)
BUN: 10 mg/dL (ref 6–20)
CO2: 28 mmol/L (ref 22–32)
Calcium: 9 mg/dL (ref 8.9–10.3)
Chloride: 99 mmol/L (ref 98–111)
Creatinine, Ser: 0.75 mg/dL (ref 0.44–1.00)
GFR calc Af Amer: >60 mL/min (ref >60)
GFR calc non Af Amer: >60 mL/min (ref >60)
Glucose, Bld: 85 mg/dL (ref 70–99)
Potassium: 3.4 mmol/L — ABNORMAL LOW (ref 3.5–5.1)
Sodium: 139 mmol/L (ref 135–145)

## 2020-03-19 LAB — SARS CORONAVIRUS 2 (TAT 6-24 HRS): SARS Coronavirus 2: NEGATIVE

## 2020-03-22 ENCOUNTER — Ambulatory Visit (HOSPITAL_COMMUNITY): Payer: 59 | Admitting: Certified Registered"

## 2020-03-22 ENCOUNTER — Ambulatory Visit (HOSPITAL_COMMUNITY)
Admission: RE | Admit: 2020-03-22 | Discharge: 2020-03-22 | Disposition: A | Discharge status: Home / Self Care | Payer: 59 | Attending: Ophthalmology | Admitting: Ophthalmology

## 2020-03-22 ENCOUNTER — Encounter (HOSPITAL_COMMUNITY)
Admission: RE | Disposition: A | Discharge status: Home / Self Care | Payer: Self-pay | Source: Home / Self Care | Attending: Ophthalmology

## 2020-03-22 ENCOUNTER — Encounter (HOSPITAL_COMMUNITY): Payer: Self-pay | Admitting: Ophthalmology

## 2020-03-22 DIAGNOSIS — H25813 Combined forms of age-related cataract, bilateral: Secondary | ICD-10-CM | POA: Insufficient documentation

## 2020-03-22 DIAGNOSIS — G4730 Sleep apnea, unspecified: Secondary | ICD-10-CM | POA: Diagnosis not present

## 2020-03-22 DIAGNOSIS — Z79899 Other long term (current) drug therapy: Secondary | ICD-10-CM | POA: Insufficient documentation

## 2020-03-22 DIAGNOSIS — I10 Essential (primary) hypertension: Secondary | ICD-10-CM | POA: Diagnosis not present

## 2020-03-22 DIAGNOSIS — M797 Fibromyalgia: Secondary | ICD-10-CM | POA: Diagnosis not present

## 2020-03-22 DIAGNOSIS — F419 Anxiety disorder, unspecified: Secondary | ICD-10-CM | POA: Diagnosis not present

## 2020-03-22 DIAGNOSIS — Z7952 Long term (current) use of systemic steroids: Secondary | ICD-10-CM | POA: Insufficient documentation

## 2020-03-22 DIAGNOSIS — F329 Major depressive disorder, single episode, unspecified: Secondary | ICD-10-CM | POA: Diagnosis not present

## 2020-03-22 DIAGNOSIS — H5442A4 Blindness left eye category 4, normal vision right eye: Secondary | ICD-10-CM | POA: Insufficient documentation

## 2020-03-22 DIAGNOSIS — H52223 Regular astigmatism, bilateral: Secondary | ICD-10-CM | POA: Insufficient documentation

## 2020-03-22 HISTORY — PX: CATARACT EXTRACTION W/PHACO: SHX586

## 2020-03-22 SURGERY — PHACOEMULSIFICATION, CATARACT, WITH IOL INSERTION
Anesthesia: Monitor Anesthesia Care | Site: Eye | Laterality: Right

## 2020-03-22 MED ORDER — EPINEPHRINE PF 1 MG/ML IJ SOLN
INTRAMUSCULAR | Status: AC
  Filled 2020-03-22: qty 2

## 2020-03-22 MED ORDER — PROVISC 10 MG/ML IO SOLN
INTRAOCULAR | Status: DC | PRN
  Administered 2020-03-22: 0.85 mL via INTRAOCULAR

## 2020-03-22 MED ORDER — TRYPAN BLUE 0.06 % OP SOLN
OPHTHALMIC | Status: AC
  Filled 2020-03-22: qty 0.5

## 2020-03-22 MED ORDER — TETRACAINE HCL 0.5 % OP SOLN
1.0000 [drp] | OPHTHALMIC | Status: AC | PRN
  Administered 2020-03-22 (×3): 1 [drp] via OPHTHALMIC

## 2020-03-22 MED ORDER — SODIUM CHLORIDE 0.9% FLUSH
10.0000 mL | INTRAVENOUS | Status: DC | PRN
  Administered 2020-03-22: 3 mL via INTRAVENOUS

## 2020-03-22 MED ORDER — EPINEPHRINE PF 1 MG/ML IJ SOLN
INTRAOCULAR | Status: DC | PRN
  Administered 2020-03-22: 500 mL

## 2020-03-22 MED ORDER — PHENYLEPHRINE HCL 2.5 % OP SOLN
1.0000 [drp] | OPHTHALMIC | Status: AC | PRN
  Administered 2020-03-22 (×3): 1 [drp] via OPHTHALMIC

## 2020-03-22 MED ORDER — LIDOCAINE HCL (PF) 1 % IJ SOLN
INTRAOCULAR | Status: DC | PRN
  Administered 2020-03-22: 1 mL via OPHTHALMIC

## 2020-03-22 MED ORDER — BSS IO SOLN
INTRAOCULAR | Status: DC | PRN
  Administered 2020-03-22: 15 mL via INTRAOCULAR

## 2020-03-22 MED ORDER — CYCLOPENTOLATE-PHENYLEPHRINE 0.2-1 % OP SOLN
1.0000 [drp] | OPHTHALMIC | Status: AC | PRN
  Administered 2020-03-22 (×3): 1 [drp] via OPHTHALMIC

## 2020-03-22 MED ORDER — MIDAZOLAM HCL 2 MG/2ML IJ SOLN
INTRAMUSCULAR | Status: AC
  Filled 2020-03-22: qty 2

## 2020-03-22 MED ORDER — SODIUM HYALURONATE 23 MG/ML IO SOLN
INTRAOCULAR | Status: DC | PRN
  Administered 2020-03-22: 0.6 mL via INTRAOCULAR

## 2020-03-22 MED ORDER — LIDOCAINE HCL 3.5 % OP GEL
1.0000 "application " | Freq: Once | OPHTHALMIC | Status: AC
  Administered 2020-03-22: 1 via OPHTHALMIC

## 2020-03-22 MED ORDER — MIDAZOLAM HCL 5 MG/5ML IJ SOLN
INTRAMUSCULAR | Status: DC | PRN
  Administered 2020-03-22: 1 mg via INTRAVENOUS

## 2020-03-22 MED ORDER — POVIDONE-IODINE 5 % OP SOLN
OPHTHALMIC | Status: DC | PRN
  Administered 2020-03-22: 1 via OPHTHALMIC

## 2020-03-22 MED ORDER — NEOMYCIN-POLYMYXIN-DEXAMETH 3.5-10000-0.1 OP SUSP
OPHTHALMIC | Status: DC | PRN
  Administered 2020-03-22: 1 [drp] via OPHTHALMIC

## 2020-03-22 SURGICAL SUPPLY — 14 items
CLOTH BEACON ORANGE TIMEOUT ST (SAFETY) ×2 IMPLANT
EYE SHIELD UNIVERSAL CLEAR (GAUZE/BANDAGES/DRESSINGS) ×2 IMPLANT
GLOVE BIOGEL PI IND STRL 6.5 (GLOVE) ×1 IMPLANT
GLOVE BIOGEL PI IND STRL 7.0 (GLOVE) ×1 IMPLANT
GLOVE BIOGEL PI INDICATOR 6.5 (GLOVE) ×2
GLOVE BIOGEL PI INDICATOR 7.0 (GLOVE) ×2
LENS ALC ACRYL/TECN (Ophthalmic Related) ×3 IMPLANT
NEEDLE HYPO 18GX1.5 BLUNT FILL (NEEDLE) ×3 IMPLANT
PAD ARMBOARD 7.5X6 YLW CONV (MISCELLANEOUS) ×3 IMPLANT
SYR TB 1ML LL NO SAFETY (SYRINGE) ×3 IMPLANT
TAPE SURG TRANSPORE 1 IN (GAUZE/BANDAGES/DRESSINGS) IMPLANT
TAPE SURGICAL TRANSPORE 1 IN (GAUZE/BANDAGES/DRESSINGS) ×3
VISCOELASTIC ADDITIONAL (OPHTHALMIC RELATED) ×2 IMPLANT
WATER STERILE IRR 250ML POUR (IV SOLUTION) ×3 IMPLANT

## 2020-03-22 NOTE — Anesthesia Procedure Notes (Signed)
Procedure Name: MAC Date/Time: 03/22/2020 7:55 AM Performed by: Vista Deck, CRNA Pre-anesthesia Checklist: Patient identified, Emergency Drugs available, Suction available, Timeout performed and Patient being monitored Patient Re-evaluated:Patient Re-evaluated prior to induction Oxygen Delivery Method: Nasal Cannula

## 2020-03-22 NOTE — Discharge Instructions (Signed)
Please discharge patient when stable, will follow up today with Dr. Hyder Deman at the [facility] [place] office immediately following discharge.  Leave shield in place until visit.  All paperwork with discharge instructions will be given at the office.   [facility] Address:  [address]  [place], [place] [address]   PATIENT INSTRUCTIONS POST-ANESTHESIA  IMMEDIATELY FOLLOWING SURGERY:  Do not drive or operate machinery for the first twenty four hours after surgery.  Do not make any important decisions for twenty four hours after surgery or while taking narcotic pain medications or sedatives.  If you develop intractable nausea and vomiting or a severe headache please notify your doctor immediately.  FOLLOW-UP:  Please make an appointment with your surgeon as instructed. You do not need to follow up with anesthesia unless specifically instructed to do so.  WOUND CARE INSTRUCTIONS (if applicable):  Keep a dry clean dressing on the anesthesia/puncture wound site if there is drainage.  Once the wound has quit draining you may leave it open to air.  Generally you should leave the bandage intact for twenty four hours unless there is drainage.  If the epidural site drains for more than 36-48 hours please call the anesthesia department.  QUESTIONS?:  Please feel free to call your physician or the hospital operator if you have any questions, and they will be happy to assist you.       

## 2020-03-22 NOTE — Anesthesia Preprocedure Evaluation (Signed)
Anesthesia Evaluation  Patient identified by MRN, date of birth, ID band Patient awake    Reviewed: Allergy & Precautions, H&P , NPO status , Patient's Chart, lab work & pertinent test results, reviewed documented beta blocker date and time   Airway Mallampati: II  TM Distance: >3 FB Neck ROM: full    Dental no notable dental hx.    Pulmonary sleep apnea ,    Pulmonary exam normal breath sounds clear to auscultation       Cardiovascular Exercise Tolerance: Good hypertension, negative cardio ROS   Rhythm:regular Rate:Normal     Neuro/Psych PSYCHIATRIC DISORDERS Anxiety Depression  Neuromuscular disease    GI/Hepatic negative GI ROS, Neg liver ROS,   Endo/Other  negative endocrine ROS  Renal/GU negative Renal ROS  negative genitourinary   Musculoskeletal   Abdominal   Peds  Hematology negative hematology ROS (+)   Anesthesia Other Findings   Reproductive/Obstetrics negative OB ROS                             Anesthesia Physical Anesthesia Plan  ASA: III  Anesthesia Plan: MAC   Post-op Pain Management:    Induction:   PONV Risk Score and Plan: 2  Airway Management Planned:   Additional Equipment:   Intra-op Plan:   Post-operative Plan:   Informed Consent: I have reviewed the patients History and Physical, chart, labs and discussed the procedure including the risks, benefits and alternatives for the proposed anesthesia with the patient or authorized representative who has indicated his/her understanding and acceptance.     Dental Advisory Given  Plan Discussed with: CRNA  Anesthesia Plan Comments:         Anesthesia Quick Evaluation

## 2020-03-22 NOTE — Interval H&P Note (Signed)
History and Physical Interval Note: The H and P was reviewed and updated. The patient was examined.  No changes were found after exam.  The surgical eye was marked.  03/22/2020 7:49 AM  Kristine Young  has presented today for surgery, with the diagnosis of Nuclear sclerotic cataract - Right eye.  The various methods of treatment have been discussed with the patient and family. After consideration of risks, benefits and other options for treatment, the patient has consented to  Procedure(s) with comments: CATARACT EXTRACTION PHACO AND INTRAOCULAR LENS PLACEMENT (IOC) (Right) - right as a surgical intervention.  The patient's history has been reviewed, patient examined, no change in status, stable for surgery.  I have reviewed the patient's chart and labs.  Questions were answered to the patient's satisfaction.     Baruch Goldmann

## 2020-03-22 NOTE — Anesthesia Postprocedure Evaluation (Signed)
Anesthesia Post Note  Patient: Kristine Young  Procedure(s) Performed: CATARACT EXTRACTION PHACO AND INTRAOCULAR LENS PLACEMENT (IOC) (Right Eye)  Patient location during evaluation: Short Stay Anesthesia Type: MAC Level of consciousness: awake and alert and patient cooperative Vital Signs Assessment: post-procedure vital signs reviewed and stable Respiratory status: spontaneous breathing Cardiovascular status: stable Postop Assessment: no apparent nausea or vomiting Anesthetic complications: no   No complications documented.   Last Vitals:  Vitals:   03/22/20 0745 03/22/20 0816  BP:  133/74  Pulse: 73 73  Resp: 20   Temp:  36.8 C  SpO2: 100% 100%    Last Pain:  Vitals:   03/22/20 0816  TempSrc: Oral  PainSc: 1                  Parrie Rasco

## 2020-03-22 NOTE — Op Note (Signed)
Date of procedure: 03/22/20  Pre-operative diagnosis:  Visually significant combined form age-related cataract, Right Eye (H25.811)  Post-operative diagnosis:  Visually significant combined form age-related cataract, Right Eye (H25.811)  Procedure: Removal of cataract via phacoemulsification and insertion of intra-ocular lens Wynetta Emery and [place]  +19.0D into the capsular bag of the Right Eye  Attending surgeon: Gerda Diss. Shriley Joffe, MD, MA  Anesthesia: MAC, Topical Akten  Complications: None  Estimated Blood Loss: <22m (minimal)  Specimens: None  Implants: As above  Indications:  Visually significant age-related cataract, Right Eye  Procedure:  The patient was seen and identified in the pre-operative area. The operative eye was identified and dilated.  The operative eye was marked.  Topical anesthesia was administered to the operative eye.     The patient was then to the operative suite and placed in the supine position.  A timeout was performed confirming the patient, procedure to be performed, and all other relevant information.   The patient's face was prepped and draped in the usual fashion for intra-ocular surgery.  A lid speculum was placed into the operative eye and the surgical microscope moved into place and focused.  A superotemporal paracentesis was created using a 20 gauge paracentesis blade.  Shugarcaine was injected into the anterior chamber.  Viscoelastic was injected into the anterior chamber.  A temporal clear-corneal main wound incision was created using a 2.449mmicrokeratome.  A continuous curvilinear capsulorrhexis was initiated using an irrigating cystitome and completed using capsulorrhexis forceps.  Hydrodissection and hydrodeliniation were performed.  Viscoelastic was injected into the anterior chamber.  A phacoemulsification handpiece and a chopper as a second instrument were used to remove the nucleus and epinucleus. The irrigation/aspiration handpiece was  used to remove any remaining cortical material.   The capsular bag was reinflated with viscoelastic, checked, and found to be intact.  The intraocular lens was inserted into the capsular bag.  The irrigation/aspiration handpiece was used to remove any remaining viscoelastic.  The clear corneal wound and paracentesis wounds were then hydrated and checked with Weck-Cels to be watertight.  The lid-speculum was removed.  The drape was removed.  The patient's face was cleaned with a wet and dry 4x4.   Maxitrol was instilled in the eye. A clear shield was taped over the eye. The patient was taken to the post-operative care unit in good condition, having tolerated the procedure well.  Post-Op Instructions: [facility] and will receive all other orders and instructions.

## 2020-03-22 NOTE — Transfer of Care (Signed)
Immediate Anesthesia Transfer of Care Note  Patient: Kristine Young  Procedure(s) Performed: CATARACT EXTRACTION PHACO AND INTRAOCULAR LENS PLACEMENT ([facility]) (Right Eye)  Patient Location: Short Stay  Anesthesia Type:MAC  Level of Consciousness: awake, alert  and patient cooperative  Airway & Oxygen Therapy: Patient Spontanous Breathing  Post-op Assessment: Report given to RN and Post -op Vital signs reviewed and stable  Post vital signs: Reviewed and stable  Last Vitals:  Vitals Value Taken Time  BP    Temp    Pulse    Resp    SpO2      Last Pain:  Vitals:   03/22/20 0709  PainSc: 0-No pain       SEE PACU FLOW SHEET FOR VITAL SIGNS  Complications: No complications documented.

## 2020-03-23 ENCOUNTER — Encounter (HOSPITAL_COMMUNITY): Payer: Self-pay | Admitting: Ophthalmology

## 2020-03-30 ENCOUNTER — Encounter (HOSPITAL_COMMUNITY)
Admission: RE | Admit: 2020-03-30 | Discharge: 2020-03-30 | Disposition: A | Discharge status: Home / Self Care | Payer: 59 | Source: Ambulatory Visit | Attending: Ophthalmology | Admitting: Ophthalmology

## 2020-03-30 ENCOUNTER — Other Ambulatory Visit: Payer: Self-pay

## 2020-03-30 NOTE — H&P (Signed)
Surgical History & Physical  Patient Name: Kristine Young DOB: 1960/10/24  Surgery: Cataract extraction with intraocular lens implant phacoemulsification; Left Eye  Surgeon: Baruch Goldmann MD Surgery Date:  04/05/2020 Pre-Op Date:  03/29/2020  HPI: A 24 Yr. old female patient 1. 1. The patient complains of difficulty when viewing TV, reading closed caption, news scrolls on TV, which began many years ago. The left eye is affected. The condition is worse with daily activities. The complaint is associated with blurry vision. This is negatively affecting the patient's quality of life. 2. The patient is returning after cataract post-op. The right eye is affected. Since the last visit, the affected area is doing well. The patient's vision is improved. Patient is following medication instructions. HPI was performed by Baruch Goldmann .  Medical History: Cataracts Lazy eye OS High Blood Pressure LDL Scleroderma, Fibromyalgia, Neuropathy  Review of Systems Negative Allergic/Immunologic Negative Cardiovascular Negative Constitutional Negative Ear, Nose, Mouth & Throat Negative Endocrine Negative Eyes Negative Gastrointestinal Negative Genitourinary Negative Hemotologic/Lymphatic Negative Integumentary Negative Musculoskeletal Negative Neurological Negative Psychiatry Negative Respiratory  Social   Never smoked  Medication Prednisolone-gatiflox-bromfenac,  Plaquenil, Prednisone, Cymbalta, Lisinopril-HCTZ,  Sx/Procedures Lazy Eye Sx Phaco c IOL OD,  Gallbladder Sx,   Drug Allergies  Codeine,   History & Physical: Heent:  Cataract, Left eye NECK: supple without bruits LUNGS: lungs clear to auscultation CV: regular rate and rhythm Abdomen: soft and non-tender  Impression & Plan: Assessment: 1.  CATARACT EXTRACTION STATUS; Right Eye (Z98.41) 2.  COMBINED FORMS AGE RELATED CATARACT; Left Eye (H25.812) 3.  (H25.12)  Plan: 1.  1 week after cataract surgery. Doing well with  improved vision and normal eye pressure. Call with any problems or concerns. Continue Gati-Brom-Pred 2x/day for 3 more weeks. 2.  Cataract accounts for the patient's decreased vision. This visual impairment is not correctable with a tolerable change in glasses or contact lenses. Cataract surgery with an implantation of a new lens should significantly improve the visual and functional status of the patient. Discussed all risks, benefits, alternatives, and potential complications. Discussed the procedures and recovery. Patient desires to have surgery. A-scan ordered and performed today for intra-ocular lens calculations. The surgery will be performed in order to improve vision for driving, reading, and for eye examinations. Recommend phacoemulsification with intra-ocular lens. Left Eye. Surgery required to correct imbalance of vision. Dilates well - shugarcaine by protocol.

## 2020-04-02 ENCOUNTER — Other Ambulatory Visit: Payer: Self-pay

## 2020-04-02 ENCOUNTER — Other Ambulatory Visit (HOSPITAL_COMMUNITY)
Admission: RE | Admit: 2020-04-02 | Discharge: 2020-04-02 | Disposition: A | Discharge status: Home / Self Care | Payer: 59 | Source: Ambulatory Visit | Attending: Ophthalmology | Admitting: Ophthalmology

## 2020-04-02 DIAGNOSIS — Z01812 Encounter for preprocedural laboratory examination: Secondary | ICD-10-CM | POA: Insufficient documentation

## 2020-04-02 DIAGNOSIS — Z20822 Contact with and (suspected) exposure to covid-19: Secondary | ICD-10-CM | POA: Insufficient documentation

## 2020-04-02 LAB — SARS CORONAVIRUS 2 (TAT 6-24 HRS): SARS Coronavirus 2: NEGATIVE

## 2020-04-05 ENCOUNTER — Ambulatory Visit (HOSPITAL_COMMUNITY)
Admission: RE | Admit: 2020-04-05 | Discharge: 2020-04-05 | Disposition: A | Discharge status: Home / Self Care | Payer: 59 | Attending: Ophthalmology | Admitting: Ophthalmology

## 2020-04-05 ENCOUNTER — Encounter (HOSPITAL_COMMUNITY): Payer: Self-pay | Admitting: Ophthalmology

## 2020-04-05 ENCOUNTER — Ambulatory Visit (HOSPITAL_COMMUNITY): Payer: 59 | Admitting: Anesthesiology

## 2020-04-05 ENCOUNTER — Encounter (HOSPITAL_COMMUNITY)
Admission: RE | Disposition: A | Discharge status: Home / Self Care | Payer: Self-pay | Source: Home / Self Care | Attending: Ophthalmology

## 2020-04-05 DIAGNOSIS — Z79899 Other long term (current) drug therapy: Secondary | ICD-10-CM | POA: Diagnosis not present

## 2020-04-05 DIAGNOSIS — Z7952 Long term (current) use of systemic steroids: Secondary | ICD-10-CM | POA: Insufficient documentation

## 2020-04-05 DIAGNOSIS — M797 Fibromyalgia: Secondary | ICD-10-CM | POA: Insufficient documentation

## 2020-04-05 DIAGNOSIS — I10 Essential (primary) hypertension: Secondary | ICD-10-CM | POA: Diagnosis not present

## 2020-04-05 DIAGNOSIS — G629 Polyneuropathy, unspecified: Secondary | ICD-10-CM | POA: Diagnosis not present

## 2020-04-05 DIAGNOSIS — E7800 Pure hypercholesterolemia, unspecified: Secondary | ICD-10-CM | POA: Diagnosis not present

## 2020-04-05 DIAGNOSIS — H25812 Combined forms of age-related cataract, left eye: Secondary | ICD-10-CM | POA: Diagnosis present

## 2020-04-05 DIAGNOSIS — M349 Systemic sclerosis, unspecified: Secondary | ICD-10-CM | POA: Insufficient documentation

## 2020-04-05 HISTORY — PX: CATARACT EXTRACTION W/PHACO: SHX586

## 2020-04-05 SURGERY — PHACOEMULSIFICATION, CATARACT, WITH IOL INSERTION
Anesthesia: Monitor Anesthesia Care | Site: Eye | Laterality: Left

## 2020-04-05 MED ORDER — SODIUM CHLORIDE (PF) 0.9 % IJ SOLN
INTRAMUSCULAR | Status: AC
  Filled 2020-04-05: qty 20

## 2020-04-05 MED ORDER — LIDOCAINE 2% (20 MG/ML) 5 ML SYRINGE
INTRAMUSCULAR | Status: AC
  Filled 2020-04-05: qty 5

## 2020-04-05 MED ORDER — LIDOCAINE HCL 3.5 % OP GEL
1.0000 "application " | Freq: Once | OPHTHALMIC | Status: AC
  Administered 2020-04-05: 1 via OPHTHALMIC

## 2020-04-05 MED ORDER — MIDAZOLAM HCL 2 MG/2ML IJ SOLN
INTRAMUSCULAR | Status: AC
  Filled 2020-04-05: qty 2

## 2020-04-05 MED ORDER — POVIDONE-IODINE 5 % OP SOLN
OPHTHALMIC | Status: DC | PRN
  Administered 2020-04-05: 1 via OPHTHALMIC

## 2020-04-05 MED ORDER — PROVISC 10 MG/ML IO SOLN
INTRAOCULAR | Status: DC | PRN
  Administered 2020-04-05: 0.85 mL via INTRAOCULAR

## 2020-04-05 MED ORDER — CYCLOPENTOLATE-PHENYLEPHRINE 0.2-1 % OP SOLN
1.0000 [drp] | OPHTHALMIC | Status: AC | PRN
  Administered 2020-04-05 (×3): 1 [drp] via OPHTHALMIC

## 2020-04-05 MED ORDER — EPINEPHRINE PF 1 MG/ML IJ SOLN
INTRAMUSCULAR | Status: AC
  Filled 2020-04-05: qty 2

## 2020-04-05 MED ORDER — SODIUM HYALURONATE 23 MG/ML IO SOLN
INTRAOCULAR | Status: DC | PRN
  Administered 2020-04-05: 0.6 mL via INTRAOCULAR

## 2020-04-05 MED ORDER — KETOROLAC TROMETHAMINE 30 MG/ML IJ SOLN
INTRAMUSCULAR | Status: AC
  Filled 2020-04-05: qty 1

## 2020-04-05 MED ORDER — TRYPAN BLUE 0.06 % OP SOLN
OPHTHALMIC | Status: AC
  Filled 2020-04-05: qty 0.5

## 2020-04-05 MED ORDER — LIDOCAINE HCL (PF) 1 % IJ SOLN
INTRAOCULAR | Status: DC | PRN
  Administered 2020-04-05: 1 mL via OPHTHALMIC

## 2020-04-05 MED ORDER — MIDAZOLAM HCL 2 MG/2ML IJ SOLN
INTRAMUSCULAR | Status: DC | PRN
  Administered 2020-04-05: 1 mg via INTRAVENOUS

## 2020-04-05 MED ORDER — TETRACAINE HCL 0.5 % OP SOLN
1.0000 [drp] | OPHTHALMIC | Status: AC | PRN
  Administered 2020-04-05 (×3): 1 [drp] via OPHTHALMIC

## 2020-04-05 MED ORDER — DEXAMETHASONE SODIUM PHOSPHATE 10 MG/ML IJ SOLN
INTRAMUSCULAR | Status: AC
  Filled 2020-04-05: qty 1

## 2020-04-05 MED ORDER — PHENYLEPHRINE HCL 2.5 % OP SOLN
1.0000 [drp] | OPHTHALMIC | Status: AC | PRN
  Administered 2020-04-05 (×3): 1 [drp] via OPHTHALMIC

## 2020-04-05 MED ORDER — BSS IO SOLN
INTRAOCULAR | Status: DC | PRN
  Administered 2020-04-05: 15 mL via INTRAOCULAR

## 2020-04-05 MED ORDER — EPINEPHRINE PF 1 MG/ML IJ SOLN
INTRAOCULAR | Status: DC | PRN
  Administered 2020-04-05: 500 mL

## 2020-04-05 MED ORDER — NEOMYCIN-POLYMYXIN-DEXAMETH 3.5-10000-0.1 OP SUSP
OPHTHALMIC | Status: DC | PRN
  Administered 2020-04-05: 1 [drp] via OPHTHALMIC

## 2020-04-05 SURGICAL SUPPLY — 14 items
CLOTH BEACON ORANGE TIMEOUT ST (SAFETY) ×2 IMPLANT
EYE SHIELD UNIVERSAL CLEAR (GAUZE/BANDAGES/DRESSINGS) ×2 IMPLANT
GLOVE BIOGEL PI IND STRL 7.0 (GLOVE) ×2 IMPLANT
GLOVE BIOGEL PI INDICATOR 7.0 (GLOVE) ×4
LENS ALC ACRYL/TECN (Ophthalmic Related) ×2 IMPLANT
NDL HYPO 18GX1.5 BLUNT FILL (NEEDLE) IMPLANT
NEEDLE HYPO 18GX1.5 BLUNT FILL (NEEDLE) ×3 IMPLANT
PAD ARMBOARD 7.5X6 YLW CONV (MISCELLANEOUS) ×2 IMPLANT
RING MALYGIN 7.0 (MISCELLANEOUS) IMPLANT
SYR TB 1ML LL NO SAFETY (SYRINGE) ×2 IMPLANT
TAPE SURG TRANSPORE 1 IN (GAUZE/BANDAGES/DRESSINGS) IMPLANT
TAPE SURGICAL TRANSPORE 1 IN (GAUZE/BANDAGES/DRESSINGS) ×3
VISCOELASTIC ADDITIONAL (OPHTHALMIC RELATED) ×2 IMPLANT
WATER STERILE IRR 250ML POUR (IV SOLUTION) ×3 IMPLANT

## 2020-04-05 NOTE — Interval H&P Note (Signed)
History and Physical Interval Note:  04/05/2020 10:57 AM  Kristine Young  has presented today for surgery, with the diagnosis of Nuclear sclerotic cataract - Left eye.  The various methods of treatment have been discussed with the patient and family. After consideration of risks, benefits and other options for treatment, the patient has consented to  Procedure(s) with comments: CATARACT EXTRACTION PHACO AND INTRAOCULAR LENS PLACEMENT ([place]) (Left) - left as a surgical intervention.  The patient's history has been reviewed, patient examined, no change in status, stable for surgery.  I have reviewed the patient's chart and labs.  Questions were answered to the patient's satisfaction.     Baruch Goldmann

## 2020-04-05 NOTE — Transfer of Care (Signed)
Immediate Anesthesia Transfer of Care Note  Patient: Kristine Young  Procedure(s) Performed: CATARACT EXTRACTION PHACO AND INTRAOCULAR LENS PLACEMENT LEFT EYE (Left Eye)  Patient Location: Short Stay  Anesthesia Type:MAC  Level of Consciousness: awake, alert , oriented and patient cooperative  Airway & Oxygen Therapy: Patient Spontanous Breathing  Post-op Assessment: Report given to RN and Post -op Vital signs reviewed and stable  Post vital signs: Reviewed and stable  Last Vitals:  Vitals Value Taken Time  BP    Temp    Pulse    Resp    SpO2      Last Pain:  Vitals:   04/05/20 1008  TempSrc: Oral  PainSc: 0-No pain      Patients Stated Pain Goal: 9 (66/06/00 4599)  Complications: No complications documented.

## 2020-04-05 NOTE — Op Note (Signed)
Date of procedure: 04/05/20  Pre-operative diagnosis: Visually significant age-related combined cataract, Left Eye (H25.812)  Post-operative diagnosis: Visually significant age-related combined cataract, Left Eye (H25.812)  Procedure: Removal of cataract via phacoemulsification and insertion of intra-ocular lens Johnson and [place]  +22.0D into the capsular bag of the Left Eye  Attending surgeon: Gerda Diss. Demeshia Sherburne, MD, MA  Anesthesia: MAC, Topical Akten  Complications: None  Estimated Blood Loss: <15m (minimal)  Specimens: None  Implants: As above  Indications:  Visually significant age-related cataract, Left Eye  Procedure:  The patient was seen and identified in the pre-operative area. The operative eye was identified and dilated.  The operative eye was marked.  Topical anesthesia was administered to the operative eye.     The patient was then to the operative suite and placed in the supine position.  A timeout was performed confirming the patient, procedure to be performed, and all other relevant information.   The patient's face was prepped and draped in the usual fashion for intra-ocular surgery.  A lid speculum was placed into the operative eye and the surgical microscope moved into place and focused.  An inferotemporal paracentesis was created using a 20 gauge paracentesis blade.  Shugarcaine was injected into the anterior chamber.  Viscoelastic was injected into the anterior chamber.  A temporal clear-corneal main wound incision was created using a 2.488mmicrokeratome.  A continuous curvilinear capsulorrhexis was initiated using an irrigating cystitome and completed using capsulorrhexis forceps.  Hydrodissection and hydrodeliniation were performed.  Viscoelastic was injected into the anterior chamber.  A phacoemulsification handpiece and a chopper as a second instrument were used to remove the nucleus and epinucleus. The irrigation/aspiration handpiece was used to remove  any remaining cortical material.   The capsular bag was reinflated with viscoelastic, checked, and found to be intact.  The intraocular lens was inserted into the capsular bag.  The irrigation/aspiration handpiece was used to remove any remaining viscoelastic.  The clear corneal wound and paracentesis wounds were then hydrated and checked with Weck-Cels to be watertight.  The lid-speculum was removed.  The drape was removed.  The patient's face was cleaned with a wet and dry 4x4.   Maxitrol was instilled in the eye. A clear shield was taped over the eye. The patient was taken to the post-operative care unit in good condition, having tolerated the procedure well.  Post-Op Instructions: [facility] and will receive all other orders and instructions.

## 2020-04-05 NOTE — Anesthesia Postprocedure Evaluation (Signed)
Anesthesia Post Note  Patient: Kristine Young  Procedure(s) Performed: CATARACT EXTRACTION PHACO AND INTRAOCULAR LENS PLACEMENT LEFT EYE (Left Eye)  Patient location during evaluation: Short Stay Anesthesia Type: MAC Level of consciousness: awake and alert Pain management: pain level controlled Vital Signs Assessment: post-procedure vital signs reviewed and stable Respiratory status: spontaneous breathing Cardiovascular status: stable Postop Assessment: no apparent nausea or vomiting Anesthetic complications: no   No complications documented.   Last Vitals:  Vitals:   04/05/20 1008  BP: 134/76  Resp: 18  Temp: 36.7 C  SpO2: 97%    Last Pain:  Vitals:   04/05/20 1008  TempSrc: Oral  PainSc: 0-No pain                 Everette Rank

## 2020-04-05 NOTE — Discharge Instructions (Addendum)
PATIENT INSTRUCTIONS POST-ANESTHESIA  IMMEDIATELY FOLLOWING SURGERY:  Do not drive or operate machinery for the first twenty four hours after surgery.  Do not make any important decisions for twenty four hours after surgery or while taking narcotic pain medications or sedatives.  If you develop intractable nausea and vomiting or a severe headache please notify your doctor immediately.  FOLLOW-UP:  Please make an appointment with your surgeon as instructed. You do not need to follow up with anesthesia unless specifically instructed to do so.  WOUND CARE INSTRUCTIONS (if applicable):  Keep a dry clean dressing on the anesthesia/puncture wound site if there is drainage.  Once the wound has quit draining you may leave it open to air.  Generally you should leave the bandage intact for twenty four hours unless there is drainage.  If the epidural site drains for more than 36-48 hours please call the anesthesia department.  QUESTIONS?:  Please feel free to call your physician or the hospital operator if you have any questions, and they will be happy to assist you.      Please discharge patient when stable, will follow up today with Dr. Wrzosek at the [facility] [place] office immediately following discharge.  Leave shield in place until visit.  All paperwork with discharge instructions will be given at the office.  [facility] Address:  [address]  [place], [place] [address]  

## 2020-04-05 NOTE — Anesthesia Preprocedure Evaluation (Signed)
Anesthesia Evaluation  Patient identified by MRN, date of birth, ID band Patient awake    Reviewed: Allergy & Precautions, H&P , NPO status , Patient's Chart, lab work & pertinent test results, reviewed documented beta blocker date and time   Airway Mallampati: II  TM Distance: >3 FB Neck ROM: full    Dental no notable dental hx.    Pulmonary neg pulmonary ROS,    Pulmonary exam normal breath sounds clear to auscultation       Cardiovascular Exercise Tolerance: Good hypertension, negative cardio ROS   Rhythm:regular Rate:Normal     Neuro/Psych PSYCHIATRIC DISORDERS Anxiety Depression  Neuromuscular disease    GI/Hepatic negative GI ROS, Neg liver ROS,   Endo/Other  negative endocrine ROS  Renal/GU negative Renal ROS  negative genitourinary   Musculoskeletal   Abdominal   Peds  Hematology negative hematology ROS (+)   Anesthesia Other Findings   Reproductive/Obstetrics negative OB ROS                             Anesthesia Physical Anesthesia Plan  ASA: III  Anesthesia Plan: MAC   Post-op Pain Management:    Induction:   PONV Risk Score and Plan:   Airway Management Planned:   Additional Equipment:   Intra-op Plan:   Post-operative Plan:   Informed Consent: I have reviewed the patients History and Physical, chart, labs and discussed the procedure including the risks, benefits and alternatives for the proposed anesthesia with the patient or authorized representative who has indicated his/her understanding and acceptance.     Dental Advisory Given  Plan Discussed with: CRNA  Anesthesia Plan Comments:         Anesthesia Quick Evaluation

## 2020-04-06 ENCOUNTER — Encounter (HOSPITAL_COMMUNITY): Payer: Self-pay | Admitting: Ophthalmology

## 2020-06-14 ENCOUNTER — Ambulatory Visit
Admission: EM | Admit: 2020-06-14 | Discharge: 2020-06-14 | Disposition: A | Discharge status: Home / Self Care | Payer: 59 | Attending: Emergency Medicine | Admitting: Emergency Medicine

## 2020-06-14 DIAGNOSIS — Z1152 Encounter for screening for COVID-19: Secondary | ICD-10-CM | POA: Diagnosis not present

## 2020-06-14 DIAGNOSIS — M898X1 Other specified disorders of bone, shoulder: Secondary | ICD-10-CM | POA: Diagnosis not present

## 2020-06-14 MED ORDER — ACETAMINOPHEN 500 MG PO TABS
500.0000 mg | ORAL_TABLET | Freq: Four times a day (QID) | ORAL | 0 refills | Status: AC | PRN
Start: 2020-06-14 — End: ?

## 2020-06-14 NOTE — Discharge Instructions (Addendum)
Take prednisone 20 mg daily for 5 days Ibuprofen 500 mg was prescribed/take as directed with food Continue to take Flexeril for muscle relaxer as prescribed   COVID testing ordered.  It will take between 2-7 days for test results.  Someone will contact you regarding abnormal results.    In the meantime: You should remain isolated in your home for 10 days from symptom onset AND greater than 24 hours after symptoms resolution (absence of fever without the use of fever-reducing medication and improvement in respiratory symptoms), whichever is longer Get plenty of rest and push fluids Use medications daily for symptom relief Use OTC medications like ibuprofen or tylenol as needed fever or pain Call or go to the ED if you have any new or worsening symptoms such as fever, worsening cough, shortness of breath, chest tightness, chest pain, turning blue, changes in mental status, etc..Marland Kitchen

## 2020-06-14 NOTE — ED Triage Notes (Signed)
Pt also reports pain radiating to chest

## 2020-06-14 NOTE — ED Triage Notes (Signed)
Pt presents with c/o right shoulder blade pain, pt states it began as itch on Saturday then became painful

## 2020-06-14 NOTE — ED Provider Notes (Signed)
[place]   [id number] 06/14/20 Arrival Time: [phone]   Chief Complaint  Patient presents with  . Shoulder Pain     SUBJECTIVE: History from: patient.  Kristine Young is a 59 y.o. female who presented to the urgent care for complaint of right shoulder blade pain for the past 2 days.  Reported pain radiate to her right chest.  Described pain as achy and tender to touch.  Denies any precipitating event, trauma or injury.  She localizes the pain to the right shoulder blade and right chest.  She describes the pain as constant and achy.  She has tried prednisone 10 mg without relief.  Her symptoms are made worse with ROM.  She denies similar symptoms in the past.  Denies  nausea, vomiting, diarrhea  ROS: As per HPI.  All other pertinent ROS negative.     Past Medical History:  Diagnosis Date  . Anxiety   . Arthritis   . Cataract   . Depression   . Fibroid   . Fibromyalgia   . Hematoma    left buttocks due to fall/ from 2 years ago (2018)  . Hypercholesterolemia   . Hypertension   . Neuropathy   . Scleroderma ([place])   . Sleep apnea    Past Surgical History:  Procedure Laterality Date  . ABDOMINAL SURGERY  1997  . CATARACT EXTRACTION W/PHACO Right 03/22/2020   Procedure: CATARACT EXTRACTION PHACO AND INTRAOCULAR LENS PLACEMENT ([facility]);  Surgeon: Baruch Goldmann, MD;  Location: [facility];  Service: Ophthalmology;  Laterality: Right;  CDE: 5.94  . CATARACT EXTRACTION W/PHACO Left 04/05/2020   Procedure: CATARACT EXTRACTION PHACO AND INTRAOCULAR LENS PLACEMENT LEFT EYE;  Surgeon: Baruch Goldmann, MD;  Location: [facility];  Service: Ophthalmology;  Laterality: Left;  CDE: 5.79  . CHOLECYSTECTOMY N/A 07/20/2014   Procedure: LAPAROSCOPIC CHOLECYSTECTOMY;  Surgeon: Jamesetta So, MD;  Location: [facility];  Service: General;  Laterality: N/A;  . EYE SURGERY Left    45 yrs ago- eye "went out of focus". x2  . MYOMECTOMY     Women's- 15 yrs ago   Allergies  Allergen Reactions  . Codeine  Itching  . Tetracyclines & Related     Tightens up stomach into a ball!   No current facility-administered medications on file prior to encounter.   Current Outpatient Medications on File Prior to Encounter  Medication Sig Dispense Refill  . cyclobenzaprine (FLEXERIL) 10 MG tablet Take 10 mg by mouth 3 (three) times daily as needed for muscle spasms.    . DULoxetine (CYMBALTA) 60 MG capsule Take 60 mg by mouth 2 (two) times daily.     . hydroxychloroquine (PLAQUENIL) 200 MG tablet Take 200 mg by mouth 2 (two) times daily.    . hydrOXYzine (ATARAX/VISTARIL) 50 MG tablet Take 50 mg by mouth 3 (three) times daily as needed for itching.     Marland Kitchen lisinopril-hydrochlorothiazide (PRINZIDE,ZESTORETIC) 10-12.5 MG per tablet Take 1 tablet by mouth daily.    . predniSONE (DELTASONE) 10 MG tablet Take 10 mg by mouth daily.     Social History   Socioeconomic History  . Marital status: Single    Spouse name: Not on file  . Number of children: 0  . Years of education: Not on file  . Highest education level: Not on file  Occupational History  . 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 Smoking status: Never Smoker  . Smokeless tobacco:  Never Used  Vaping Use  . Vaping Use: Never used  Substance and Sexual Activity  . Alcohol use: No  . Drug use: No  . Sexual activity: Not Currently    Partners: Male    Birth control/protection: Post-menopausal  Other Topics Concern  . Not on file  Social History Narrative  . Not on file   Social Determinants of Health   Financial Resource Strain:   . Difficulty of Paying Living Expenses: Not on file  Food Insecurity:   . Worried About Charity fundraiser in the Last Year: Not on file  . Ran Out of Food in the Last Year: Not on file  Transportation Needs:   . Lack of Transportation (Medical): Not on file  . Lack of Transportation (Non-Medical): Not on file  Physical Activity:   . Days of Exercise per Week: Not on  file  . Minutes of Exercise per Session: Not on file  Stress:   . Feeling of Stress : Not on file  Social Connections:   . Frequency of Communication with Friends and Family: Not on file  . Frequency of Social Gatherings with Friends and Family: Not on file  . Attends Religious Services: Not on file  . Active Member of Clubs or Organizations: Not on file  . Attends Archivist Meetings: Not on file  . Marital Status: Not on file  Intimate Partner Violence:   . Fear of Current or Ex-Partner: Not on file  . Emotionally Abused: Not on file  . Physically Abused: Not on file  . Sexually Abused: Not on file   Family History  Adopted: Yes  Problem Relation Age of Onset  . Colon cancer Father 36  . Alzheimer's disease Father   . Liver disease Neg Hx   . Pancreatic disease Neg Hx     OBJECTIVE:  Vitals:   06/14/20 0959  BP: (!) 143/89  Pulse: 95  Resp: 20  Temp: 99.2 F (37.3 C)  SpO2: 96%     Physical Exam Vitals and nursing note reviewed.  Constitutional:      General: She is not in acute distress.    Appearance: Normal appearance. She is normal weight. She is not ill-appearing, toxic-appearing or diaphoretic.  HENT:     Head: Normocephalic.  Cardiovascular:     Rate and Rhythm: Normal rate and regular rhythm.     Pulses: Normal pulses.     Heart sounds: Normal heart sounds. No murmur heard.  No friction rub. No gallop.   Pulmonary:     Effort: Pulmonary effort is normal. No respiratory distress.     Breath sounds: Normal breath sounds. No stridor. No wheezing, rhonchi or rales.  Chest:     Chest wall: No tenderness.  Musculoskeletal:        General: Tenderness present.     Right shoulder: Tenderness present.     Comments: The right shoulder is without any obvious asymmetry or deformity compared to the left shoulder.  No surface trauma, ecchymosis, open wound.  Normal range of motion with pain.  Neurovascular status intact.  Neurological:     Mental  Status: She is alert and oriented to person, place, and time.      LABS:  No results found for this or any previous visit (from the past 24 hour(s)).   Patient is stable at discharge.  Symptom is likely body ache symptoms from Covid. She is having low-grade temp.  This suggest possible infection therefore I will  rule out Covid  ASSESSMENT & PLAN:  1. Shoulder blade pain   2. Encounter for screening for COVID-19     Meds ordered this encounter  Medications  . acetaminophen (TYLENOL) 500 MG tablet    Sig: Take 1 tablet (500 mg total) by mouth every 6 (six) hours as needed.    Dispense:  30 tablet    Refill:  0   Discharge Instructions  Take prednisone 20 mg daily for 5 days Ibuprofen 500 mg was prescribed/take as directed with food Continue to take Flexeril for muscle relaxer as prescribed   COVID testing ordered.  It will take between 2-7 days for test results.  Someone will contact you regarding abnormal results.    In the meantime: You should remain isolated in your home for 10 days from symptom onset AND greater than 24  hours after symptoms resolution (absence of fever without the use of fever-reducing medication and improvement in respiratory symptoms), whichever is longer Get plenty of rest and push fluids Use medications daily for symptom relief Use OTC medications like ibuprofen or tylenol as needed fever or pain Call or go to the ED if you have any new or worsening symptoms such as fever, worsening cough, shortness of breath, chest tightness, chest pain, turning blue, changes in mental status, etc...   Reviewed expectations re: course of current medical issues. Questions answered. Outlined signs and symptoms indicating need for more acute intervention. Patient verbalized understanding. After Visit Summary given.      Note: This document was prepared using Dragon voice recognition software and may include unintentional dictation errors.    Emerson Monte, FNP 06/14/20 1027

## 2020-06-17 LAB — NOVEL CORONAVIRUS, NAA: SARS-CoV-2, NAA: NOT DETECTED

## 2020-09-30 ENCOUNTER — Ambulatory Visit: Payer: 59 | Admitting: Internal Medicine

## 2020-10-29 ENCOUNTER — Other Ambulatory Visit: Payer: Self-pay

## 2020-10-29 ENCOUNTER — Ambulatory Visit (INDEPENDENT_AMBULATORY_CARE_PROVIDER_SITE_OTHER): Payer: 59 | Admitting: Internal Medicine

## 2020-10-29 ENCOUNTER — Encounter: Payer: Self-pay | Admitting: Internal Medicine

## 2020-10-29 VITALS — BP 119/78 | HR 82 | Ht 62.5 in | Wt 208.0 lb

## 2020-10-29 DIAGNOSIS — G629 Polyneuropathy, unspecified: Secondary | ICD-10-CM | POA: Insufficient documentation

## 2020-10-29 DIAGNOSIS — R768 Other specified abnormal immunological findings in serum: Secondary | ICD-10-CM | POA: Diagnosis not present

## 2020-10-29 DIAGNOSIS — R21 Rash and other nonspecific skin eruption: Secondary | ICD-10-CM | POA: Insufficient documentation

## 2020-10-29 DIAGNOSIS — G8929 Other chronic pain: Secondary | ICD-10-CM

## 2020-10-29 DIAGNOSIS — M25551 Pain in right hip: Secondary | ICD-10-CM | POA: Diagnosis not present

## 2020-10-29 DIAGNOSIS — M25562 Pain in left knee: Secondary | ICD-10-CM

## 2020-10-29 DIAGNOSIS — M349 Systemic sclerosis, unspecified: Secondary | ICD-10-CM | POA: Insufficient documentation

## 2020-10-29 DIAGNOSIS — M25561 Pain in right knee: Secondary | ICD-10-CM | POA: Insufficient documentation

## 2020-10-29 DIAGNOSIS — M25552 Pain in left hip: Secondary | ICD-10-CM

## 2020-10-29 MED ORDER — HYDROXYCHLOROQUINE SULFATE 200 MG PO TABS: 200.0000 mg | ORAL_TABLET | Freq: Two times a day (BID) | ORAL | 2 refills | Status: DC

## 2020-10-29 MED ORDER — PREDNISONE 10 MG PO TABS: 10.0000 mg | ORAL_TABLET | Freq: Every day | ORAL | 2 refills | Status: DC

## 2020-10-29 NOTE — Progress Notes (Signed)
Office Visit Note  Patient: Kristine Young             Date of Birth: 06-Dec-1960           MRN: [id number]             PCP: Monico Blitz, MD Referring: Berenice Primas, NP Visit Date: 10/29/2020 Occupation: CNA  Subjective:  New Patient (Initial Visit) (Patient was previously seen by rheumatology in [place], [place]. (Dr. Arlyss Gandy). Patient has not been seen there in over a year due to changes in insurance. Patient is currently being prescribed PLQ by Dr. Manuella Ghazi.) and Joint Pain (Patient complains of low back pain, bilateral shoulder pain (right>left), bilateral hands, bilateral feet, and muscle pain in both thighs)   History of Present Illness: Kristine Young is a 60 y.o. female here for scleroderma. She takes hydroxychloroquine 400mg  daily and prednisone 10mg  daily for arthritis symptoms currently.  She was initially diagnosed by Dr. Scarlette Shorts since about 2 years ago based on arthritis, skin changes, and high positive anti centromere Abs. Her arthritis involves multiple joints including bilateral shoulders, hands, knees, feet, and myalgias of the thighs. She lacks typical raynaud's symptoms or scleroderma related skin thickening of the hands but does have changes with skin discoloration on her arms and legs.  She sees ophthalmology in [place] and had cataract surgery about 6 months ago. She reports up to 40 lb weight gain since this problem started. She has had an echocardiogram apparently no significant problems identified. She has had PFTs consistent with mild restrictive defect.  Activities of Daily Living:  Patient reports morning stiffness for 1-24 hours.   Patient Reports nocturnal pain.  Difficulty dressing/grooming: Reports Difficulty climbing stairs: Reports Difficulty getting out of chair: Reports Difficulty using hands for taps, buttons, cutlery, and/or writing: Denies  Review of Systems  Constitutional: Positive for fatigue.  HENT: Positive for mouth dryness.  Negative for mouth sores and nose dryness.   Eyes: Negative for pain, itching, visual disturbance and dryness.  Respiratory: Negative for cough, hemoptysis, shortness of breath and difficulty breathing.   Cardiovascular: Negative for chest pain, palpitations and swelling in legs/feet.  Gastrointestinal: Positive for diarrhea. Negative for abdominal pain, blood in stool and constipation.  Endocrine: Negative for increased urination.  Genitourinary: Negative for painful urination.  Musculoskeletal: Positive for arthralgias, joint pain, myalgias, muscle weakness, morning stiffness, muscle tenderness and myalgias. Negative for joint swelling.  Skin: Negative for color change, rash and redness.  Allergic/Immunologic: Negative for susceptible to infections.  Neurological: Positive for weakness. Negative for dizziness, numbness, headaches and memory loss.  Hematological: Negative for swollen glands.  Psychiatric/Behavioral: Positive for sleep disturbance. Negative for confusion.    PMFS History:  Patient Active Problem List   Diagnosis Date Noted  . Centromere antibody positive 10/29/2020  . Rash and other nonspecific skin eruption 10/29/2020  . Chronic arthralgias of knees and hips 10/29/2020  . Peripheral neuropathy 10/29/2020  . Right upper quadrant pain 07/09/2014    Past Medical History:  Diagnosis Date  . Anxiety   . Arthritis   . Cataract   . Depression   . Fibroid   . Fibromyalgia   . Hematoma    left buttocks due to fall/ from 2 years ago (2018)  . Hypercholesterolemia   . Hypertension   . Neuropathy   . Scleroderma ([place])   . Sleep apnea     Family History  Adopted: Yes  Problem Relation Age of Onset  . Colon  cancer Father 43  . Alzheimer's disease Father   . Thyroid disease Sister   . Liver disease Neg Hx   . Pancreatic disease Neg Hx    Past Surgical History:  Procedure Laterality Date  . ABDOMINAL SURGERY  1997  . CATARACT EXTRACTION W/PHACO Right 03/22/2020    Procedure: CATARACT EXTRACTION PHACO AND INTRAOCULAR LENS PLACEMENT (IOC);  Surgeon: Baruch Goldmann, MD;  Location: [facility];  Service: Ophthalmology;  Laterality: Right;  CDE: 5.94  . CATARACT EXTRACTION W/PHACO Left 04/05/2020   Procedure: CATARACT EXTRACTION PHACO AND INTRAOCULAR LENS PLACEMENT LEFT EYE;  Surgeon: Baruch Goldmann, MD;  Location: [facility];  Service: Ophthalmology;  Laterality: Left;  CDE: 5.79  . CHOLECYSTECTOMY N/A 07/20/2014   Procedure: LAPAROSCOPIC CHOLECYSTECTOMY;  Surgeon: Jamesetta So, MD;  Location: [facility];  Service: General;  Laterality: N/A;  . EYE SURGERY Left    45 yrs ago- eye "went out of focus". x2  . MYOMECTOMY     Women's- 15 yrs ago   Social History   Social History Narrative  . Not on file   Immunization History  Administered Date(s) Administered  . Moderna Sars-Covid-2 Vaccination 12/09/2019, 01/06/2020  . Tdap 10/20/2016     Objective: Vital Signs: BP 119/78 (BP Location: Right Arm, Patient Position: Sitting, Cuff Size: Large)   Pulse 82   Ht 5' 2.5" (1.588 m)   Wt 208 lb (94.3 kg)   BMI 37.44 kg/m    Physical Exam Constitutional:      Appearance: She is obese.  HENT:     Right Ear: External ear normal.     Left Ear: External ear normal.     Mouth/Throat:     Mouth: Mucous membranes are moist.     Pharynx: Oropharynx is clear.  Eyes:     Conjunctiva/sclera: Conjunctivae normal.  Cardiovascular:     Rate and Rhythm: Normal rate and regular rhythm.  Pulmonary:     Effort: Pulmonary effort is normal.     Breath sounds: Normal breath sounds.  Skin:    General: Skin is warm and dry.     Comments: Skin changes on forearm extensor surfaces possible perifollicular hyperpigmentation No nail changes, no hand skin thickening, nailfold capillaroscopy some dropout no giant capillaries or hemorrhages Lower leg excoriations, skin waxy and hairless    Neurological:     Mental Status: She is alert.     Musculoskeletal Exam:  Neck full range  of motion Shoulder, elbow, wrist, fingers full range of motion no tenderness or swelling Very tender to lateral hip palpation and lumbar paraspinal muscles to lateral hip palpation Patellofemoral crepitus with normal ROM no effusions   Investigation: No additional findings.  Imaging: No results found.  Recent Labs: Lab Results  Component Value Date   WBC 7.6 07/17/2014   HGB 12.9 07/17/2014   PLT 277 07/17/2014   NA 139 03/19/2020   K 3.4 (L) 03/19/2020   CL 99 03/19/2020   CO2 28 03/19/2020   GLUCOSE 85 03/19/2020   BUN 10 03/19/2020   CREATININE 0.75 03/19/2020   BILITOT 0.2 (L) 07/17/2014   ALKPHOS 98 07/17/2014   AST 33 07/17/2014   ALT 68 (H) 07/17/2014   PROT 7.9 07/17/2014   ALBUMIN 4.2 07/17/2014   CALCIUM 9.0 03/19/2020   GFRAA >60 03/19/2020    Speciality Comments: No specialty comments available.  Procedures:  No procedures performed Allergies: Codeine and Tetracyclines & related   Assessment / Plan:     Visit Diagnoses: Centromere  antibody positive - Plan: predniSONE (DELTASONE) 10 MG tablet, hydroxychloroquine (PLAQUENIL) 200 MG tablet  Positive ANA with centromere Abs with sicca symptoms, skin changes, arthralgias, restrictive lung defect although lacks skin thickening of sclerosis changes. Will plan to continue existing treatment for now HCQ and prednisone. Unclear if indefinite prednisone is needed, is causing some unintentional weight gain. For now HCQ 400mg  and prednisone 10mg  daily medications and will f/u clinical progress.  Rash and other nonspecific skin eruption   Upper extremity skin mixed hyper- and hypopigmentation but no skin thickening consistent with limited cutaneous systemic sclerosis. Strongly agree with continued HCQ for skin disease activity, can review prednisone dosing in future f/u.  Chronic arthralgias of knees and hips   Hip and knee pain may be related to degenerative arthritis changes vs arthralgia or both. No active  inflammation seen on exam today. Will continue current treatment plan but also recommending lower extremity stretching exercises for hip and quadriceps muscles particularly.  Peripheral polyneuropathy  Orders: No orders of the defined types were placed in this encounter.  Meds ordered this encounter  Medications  . predniSONE (DELTASONE) 10 MG tablet    Sig: Take 1 tablet (10 mg total) by mouth daily.    Dispense:  30 tablet    Refill:  2  . hydroxychloroquine (PLAQUENIL) 200 MG tablet    Sig: Take 1 tablet (200 mg total) by mouth 2 (two) times daily.    Dispense:  60 tablet    Refill:  2    Follow-Up Instructions: Return in about 3 months (around 01/27/2021) for Limited SSc.   Collier Salina, MD  Note - This record has been created using [employer].  Chart creation errors have been sought, but may not always  have been located. Such creation errors do not reflect on  the standard of medical care.

## 2020-10-29 NOTE — Patient Instructions (Addendum)
Continue current medications hydroxychloroquine 400mg  daily and prednisone 10mg  daily. We will follow up in about 3 months unless as needed sooner.  I believe some of the back and hip pain is related to muscle spasms not just from inflammation so would benefit with regular stretching to reduce pain during activities.  Piriformis and gluteal tendonitis stretches Ask your health care provider which exercises are safe for you. Do exercises exactly as told by your health care provider and adjust them as directed. It is normal to feel mild stretching, pulling, tightness, or discomfort as you do these exercises. Stop right away if you feel sudden pain or your pain gets worse. Stretching and range-of-motion exercises These exercises warm up your muscles and joints and improve the movement and flexibility of your hip and pelvis. The exercises also help to relieve pain, numbness, and tingling. Hip rotation This is an exercise in which you lie on your back and stretch the muscles that rotate your hip (hip rotators) to stretch your buttocks. 1. Lie on your back on a firm surface. 2. Pull your left / right knee toward your same shoulder with your left / right hand until your knee is pointing toward the ceiling. Hold your left / right ankle with your other hand. 3. Keeping your knee steady, gently pull your left / right ankle toward your other shoulder until you feel a stretch in your buttocks. 4. Hold this position for 15-20 seconds.  Complete this exercise 2 times a day.    Hip extensor This is an exercise in which you lie on your back and pull your knee to your chest. 1. Lie on your back on a firm surface. Both of your legs should be straight. 2. Pull your left / right knee to your chest. Hold your leg in this position by holding onto the back of your thigh or the front of your knee. 3. Hold this position for 15-20 seconds. 4. Slowly return to the starting position. Complete this exercise 2 times a  day.  Quadriceps stretch, prone 5. Lie on your abdomen on a firm surface, such as a bed or padded floor (prone position). 6. Bend your left / right knee and hold your ankle. If you cannot reach your ankle or pant leg, loop a belt around your foot and grab the belt instead. 7. Gently pull your heel toward your buttocks. Your knee should not slide out to the side. You should feel a stretch in the front of your thigh and knee (quadriceps). 8. Hold this position for 15-20 seconds. Complete this exercise 2 times a day.

## 2020-11-10 ENCOUNTER — Ambulatory Visit: Payer: 59 | Admitting: Internal Medicine

## 2021-01-05 ENCOUNTER — Other Ambulatory Visit: Payer: Self-pay | Admitting: Internal Medicine

## 2021-01-05 DIAGNOSIS — Z1231 Encounter for screening mammogram for malignant neoplasm of breast: Secondary | ICD-10-CM

## 2021-01-10 ENCOUNTER — Other Ambulatory Visit: Payer: Self-pay

## 2021-01-10 ENCOUNTER — Ambulatory Visit
Admission: RE | Admit: 2021-01-10 | Discharge: 2021-01-10 | Disposition: A | Discharge status: Home / Self Care | Payer: 59 | Source: Ambulatory Visit | Attending: Internal Medicine | Admitting: Internal Medicine

## 2021-01-10 DIAGNOSIS — Z1231 Encounter for screening mammogram for malignant neoplasm of breast: Secondary | ICD-10-CM

## 2021-01-27 NOTE — Progress Notes (Signed)
Office Visit Note  Patient: Kristine Young             Date of Birth: Dec 25, 1960           MRN: [id number]             PCP: Monico Blitz, MD Referring: Monico Blitz, MD Visit Date: 01/28/2021   Subjective:  Follow-up (Patient's symptoms seem well controlled. Occasionally patient notices burning sensation in bilateral great toes. )   History of Present Illness: Kristine Young is a 60 y.o. female here for follow up for limited SSc. She continues taking hydroxychloroquine 400 mg daily and prednisone 10 mg daily. Joint pain is doing well although she notices episodic burning pain on the top side of her great toes. This comes and goes lasting only a few minutes at a time. She has noticed some increase in dark skin spots around hairs and pores on the skin of both arms and some on the chest. She denies any problems with shortness of breath, chest pain, leg swelling.   Review of Systems  Constitutional: Positive for fatigue.  HENT: Negative for mouth sores, mouth dryness and nose dryness.   Eyes: Positive for itching. Negative for pain, visual disturbance and dryness.  Respiratory: Negative for cough, hemoptysis, shortness of breath and difficulty breathing.   Cardiovascular: Negative for chest pain, palpitations and swelling in legs/feet.  Gastrointestinal: Positive for constipation. Negative for abdominal pain, blood in stool and diarrhea.  Endocrine: Negative for increased urination.  Genitourinary: Negative for painful urination.  Musculoskeletal: Positive for arthralgias and joint pain. Negative for joint swelling, myalgias, muscle weakness, morning stiffness, muscle tenderness and myalgias.  Skin: Negative for color change, rash and redness.  Allergic/Immunologic: Negative for susceptible to infections.  Neurological: Negative for dizziness, numbness, headaches, memory loss and weakness.  Hematological: Negative for swollen glands.  Psychiatric/Behavioral: Positive for sleep  disturbance. Negative for confusion.    PMFS History:  Patient Active Problem List   Diagnosis Date Noted  . Centromere antibody positive 10/29/2020  . Rash and other nonspecific skin eruption 10/29/2020  . Chronic arthralgias of knees and hips 10/29/2020  . Peripheral neuropathy 10/29/2020  . Right upper quadrant pain 07/09/2014    Past Medical History:  Diagnosis Date  . Anxiety   . Arthritis   . Cataract   . Depression   . Fibroid   . Fibromyalgia   . Hematoma    left buttocks due to fall/ from 2 years ago (2018)  . Hypercholesterolemia   . Hypertension   . Neuropathy   . Scleroderma ([place])   . Sleep apnea     Family History  Adopted: Yes  Problem Relation Age of Onset  . Colon cancer Father 15  . Alzheimer's disease Father   . Thyroid disease Sister   . Liver disease Neg Hx   . Pancreatic disease Neg Hx    Past Surgical History:  Procedure Laterality Date  . ABDOMINAL SURGERY  1997  . CATARACT EXTRACTION W/PHACO Right 03/22/2020   Procedure: CATARACT EXTRACTION PHACO AND INTRAOCULAR LENS PLACEMENT (IOC);  Surgeon: Baruch Goldmann, MD;  Location: [facility];  Service: Ophthalmology;  Laterality: Right;  CDE: 5.94  . CATARACT EXTRACTION W/PHACO Left 04/05/2020   Procedure: CATARACT EXTRACTION PHACO AND INTRAOCULAR LENS PLACEMENT LEFT EYE;  Surgeon: Baruch Goldmann, MD;  Location: [facility];  Service: Ophthalmology;  Laterality: Left;  CDE: 5.79  . CHOLECYSTECTOMY N/A 07/20/2014   Procedure: LAPAROSCOPIC CHOLECYSTECTOMY;  Surgeon: Jamesetta So, MD;  Location: [facility];  Service: General;  Laterality: N/A;  . EYE SURGERY Left    45 yrs ago- eye "went out of focus". x2  . MYOMECTOMY     Women's- 15 yrs ago   Social History   Social History Narrative  . Not on file   Immunization History  Administered Date(s) Administered  . Moderna Sars-Covid-2 Vaccination 12/09/2019, 01/06/2020  . Tdap 10/20/2016     Objective: Vital Signs: BP 129/74 (BP Location: Left Arm, Patient  Position: Sitting, Cuff Size: Large)   Pulse 90   Ht 5' 2.5" (1.588 m)   Wt 205 lb 12.8 oz (93.4 kg)   BMI 37.04 kg/m    Physical Exam Constitutional:      Appearance: She is obese.  HENT:     Mouth/Throat:     Mouth: Mucous membranes are moist.     Pharynx: Oropharynx is clear.  Eyes:     Conjunctiva/sclera: Conjunctivae normal.  Skin:    General: Skin is warm and dry.     Comments: Scattered hyperpigmentations around air follicles on both forearms, patchy on left extensor surface, decreased skin pigment on dorsum of fingers between MCP and PIP joints  Neurological:     General: No focal deficit present.     Mental Status: She is alert.  Psychiatric:        Mood and Affect: Mood normal.     Musculoskeletal Exam:  Elbows full ROM no tenderness or swelling Wrists full ROM no tenderness or swelling Fingers full ROM no tenderness or swelling Knees full ROM no tenderness or swelling Ankles full ROM no tenderness or swelling MTPs full ROM no tenderness or swelling  Investigation: No additional findings.  Imaging: MM 3D SCREEN BREAST BILATERAL  Result Date: 01/11/2021 CLINICAL DATA:  Screening. EXAM: DIGITAL SCREENING BILATERAL MAMMOGRAM WITH TOMOSYNTHESIS AND CAD TECHNIQUE: Bilateral screening digital craniocaudal and mediolateral oblique mammograms were obtained. Bilateral screening digital breast tomosynthesis was performed. The images were evaluated with computer-aided detection. COMPARISON:  Previous exam(s). ACR Breast Density Category a: The breast tissue is almost entirely fatty. FINDINGS: There are no findings suspicious for malignancy. The images were evaluated with computer-aided detection. IMPRESSION: No mammographic evidence of malignancy. A result letter of this screening mammogram will be mailed directly to the patient. RECOMMENDATION: Screening mammogram in one year. (Code:SM-B-01Y) BI-RADS CATEGORY  1: Negative. Electronically Signed   By: Ammie Ferrier M.D.    On: 01/11/2021 12:54    Recent Labs: Lab Results  Component Value Date   WBC 7.6 07/17/2014   HGB 12.9 07/17/2014   PLT 277 07/17/2014   NA 139 03/19/2020   K 3.4 (L) 03/19/2020   CL 99 03/19/2020   CO2 28 03/19/2020   GLUCOSE 85 03/19/2020   BUN 10 03/19/2020   CREATININE 0.75 03/19/2020   BILITOT 0.2 (L) 07/17/2014   ALKPHOS 98 07/17/2014   AST 33 07/17/2014   ALT 68 (H) 07/17/2014   PROT 7.9 07/17/2014   ALBUMIN 4.2 07/17/2014   CALCIUM 9.0 03/19/2020   GFRAA >60 03/19/2020    Speciality Comments: No specialty comments available.  Procedures:  No procedures performed Allergies: Codeine and Tetracyclines & related   Assessment / Plan:     Visit Diagnoses: Rash and other nonspecific skin eruption  Centromere antibody positive - Plan: Sedimentation rate, CBC with Differential/Platelet, COMPLETE METABOLIC PANEL WITH GFR, Urinalysis, Anti-scleroderma antibody, RNA Polymerase III Aby  Skin changes do still seem possibly consistent with cutaneous systemic sclerosis there is some puffiness  of hyperpigmentation and portions of the fingers on both hands.  No significant progression or change in other symptoms.  We will check sed rate, CBC, CMP, urinalysis screening any other disease activity or medication effects.  We will check SCL 70 and RNA probably 3 as well screening for serology risk of systemic involvement.  Continue hydroxychloroquine 400 mg daily, if symptoms remain stable will recommend beginning to taper down the prednisone dose to less than 10 mg by 2.5 mg increment.  Peripheral polyneuropathy  She has chronic peripheral neuropathy significant foot calluses. The burning sensation changes on the great toes sound more like an impingement type process is not currently bothering her to an excessive extent so we will observe for now. If it increases by a lot, she might benefit from podiatry evaluation.  Orders: Orders Placed This Encounter  Procedures  . Sedimentation  rate  . CBC with Differential/Platelet  . COMPLETE METABOLIC PANEL WITH GFR  . Urinalysis  . Anti-scleroderma antibody  . RNA Polymerase III Aby   No orders of the defined types were placed in this encounter.   Follow-Up Instructions: Return in about 3 months (around 04/29/2021) for Limited SSc HCQ and prednisone with possible tapering.   Collier Salina, MD  Note - This record has been created using [employer].  Chart creation errors have been sought, but may not always  have been located. Such creation errors do not reflect on  the standard of medical care.

## 2021-01-28 ENCOUNTER — Ambulatory Visit: Payer: 59 | Admitting: Internal Medicine

## 2021-01-28 ENCOUNTER — Other Ambulatory Visit: Payer: Self-pay

## 2021-01-28 ENCOUNTER — Encounter: Payer: Self-pay | Admitting: Internal Medicine

## 2021-01-28 VITALS — BP 129/74 | HR 90 | Ht 62.5 in | Wt 205.8 lb

## 2021-01-28 DIAGNOSIS — M25562 Pain in left knee: Secondary | ICD-10-CM

## 2021-01-28 DIAGNOSIS — R21 Rash and other nonspecific skin eruption: Secondary | ICD-10-CM

## 2021-01-28 DIAGNOSIS — R768 Other specified abnormal immunological findings in serum: Secondary | ICD-10-CM | POA: Diagnosis not present

## 2021-01-28 DIAGNOSIS — M25552 Pain in left hip: Secondary | ICD-10-CM

## 2021-01-28 DIAGNOSIS — G629 Polyneuropathy, unspecified: Secondary | ICD-10-CM | POA: Diagnosis not present

## 2021-01-28 DIAGNOSIS — M25561 Pain in right knee: Secondary | ICD-10-CM

## 2021-01-28 DIAGNOSIS — M25551 Pain in right hip: Secondary | ICD-10-CM

## 2021-01-28 DIAGNOSIS — G8929 Other chronic pain: Secondary | ICD-10-CM

## 2021-02-02 LAB — SEDIMENTATION RATE: Sed Rate: 6 mm/h (ref 0–30)

## 2021-02-02 LAB — CBC WITH DIFFERENTIAL/PLATELET
Absolute Monocytes: 963 cells/uL — ABNORMAL HIGH (ref 200–950)
Basophils Absolute: 54 cells/uL (ref 0–200)
Basophils Relative: 0.6 %
Eosinophils Absolute: 117 cells/uL (ref 15–500)
Eosinophils Relative: 1.3 %
HCT: 40.9 % (ref 35.0–45.0)
Hemoglobin: 13.5 g/dL (ref 11.7–15.5)
Lymphs Abs: 3294 cells/uL (ref 850–3900)
MCH: 31.8 pg (ref 27.0–33.0)
MCHC: 33 g/dL (ref 32.0–36.0)
MCV: 96.5 fL (ref 80.0–100.0)
MPV: 10.8 fL (ref 7.5–12.5)
Monocytes Relative: 10.7 %
Neutro Abs: 4572 cells/uL (ref 1500–7800)
Neutrophils Relative %: 50.8 %
Platelets: 254 10*3/uL (ref 140–400)
RBC: 4.24 10*6/uL (ref 3.80–5.10)
RDW: 11.9 % (ref 11.0–15.0)
Total Lymphocyte: 36.6 %
WBC: 9 10*3/uL (ref 3.8–10.8)

## 2021-02-02 LAB — URINALYSIS
Bilirubin Urine: NEGATIVE
Glucose, UA: NEGATIVE
Hgb urine dipstick: NEGATIVE
Ketones, ur: NEGATIVE
Leukocytes,Ua: NEGATIVE
Nitrite: NEGATIVE
Specific Gravity, Urine: 1.021 (ref 1.001–1.035)
pH: <=5 (ref 5.0–8.0)

## 2021-02-02 LAB — COMPLETE METABOLIC PANEL WITH GFR
AG Ratio: 2 (calc) (ref 1.0–2.5)
ALT: 16 U/L (ref 6–29)
AST: 14 U/L (ref 10–35)
Albumin: 4.3 g/dL (ref 3.6–5.1)
Alkaline phosphatase (APISO): 79 U/L (ref 37–153)
BUN: 9 mg/dL (ref 7–25)
CO2: 29 mmol/L (ref 20–32)
Calcium: 9.1 mg/dL (ref 8.6–10.4)
Chloride: 102 mmol/L (ref 98–110)
Creat: 0.79 mg/dL (ref 0.50–0.99)
GFR, Est African American: 94 mL/min/{1.73_m2} (ref >=60)
GFR, Est Non African American: 81 mL/min/{1.73_m2} (ref >=60)
Globulin: 2.2 g/dL (calc) (ref 1.9–3.7)
Glucose, Bld: 72 mg/dL (ref 65–99)
Potassium: 3.9 mmol/L (ref 3.5–5.3)
Sodium: 141 mmol/L (ref 135–146)
Total Bilirubin: 0.4 mg/dL (ref 0.2–1.2)
Total Protein: 6.5 g/dL (ref 6.1–8.1)

## 2021-02-02 LAB — RNA POLYMERASE III ABY: RNA Polymerase III Aby: <20 Units (ref <20)

## 2021-02-02 LAB — ANTI-SCLERODERMA ANTIBODY: Scleroderma (Scl-70) (ENA) Antibody, IgG: <1 AI

## 2021-02-03 MED ORDER — HYDROXYCHLOROQUINE SULFATE 200 MG PO TABS: 200.0000 mg | ORAL_TABLET | Freq: Two times a day (BID) | ORAL | 2 refills | Status: DC

## 2021-02-03 MED ORDER — PREDNISONE 5 MG PO TABS: ORAL_TABLET | ORAL | 0 refills | Status: DC

## 2021-02-03 NOTE — Progress Notes (Signed)
Blood test results look okay with normal blood count and inflammatory markers. There is a small amount of protein in the urine, we can check to measure exactly how much at the next follow up. I recommend she continue the hydroxychloroquine 2 tablets per day but try decreasing the prednisone. I sent a new Rx for 5 mg tablets she can take 1.5 tablets (7.5 mg) per day for a month then go down to 1 tablet (5 mg) daily.

## 2021-02-03 NOTE — Addendum Note (Signed)
Addended by: Collier Salina on: 02/03/2021 08:54 AM   Modules accepted: Orders

## 2021-03-23 ENCOUNTER — Other Ambulatory Visit: Payer: Self-pay | Admitting: Obstetrics & Gynecology

## 2021-05-02 NOTE — Progress Notes (Signed)
Office Visit Note  Patient: Kristine Young             Date of Birth: 1961-06-04           MRN: [id number]             PCP: Monico Blitz, MD Referring: Monico Blitz, MD Visit Date: 05/03/2021   Subjective:  Follow-up (Patient feels as if symptoms are manageable on PLQ 400 mg daily and Prednisone 5 mg daily. )   History of Present Illness: Kristine Young is a 60 y.o. 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  She feels the symptoms are overall pretty well controlled.  Biggest complaint is areas of skin itching and hyperpigmentation especially in linear distributions down her legs.  She treats these locally with topical emollients.  Previous HPI: 01/28/21 [name] is a 60 y.o. female here for follow up for limited SSc. She continues taking hydroxychloroquine 400 mg daily and prednisone 10 mg daily. Joint pain is doing well although she notices episodic burning pain on the top side of her great toes. This comes and goes lasting only a few minutes at a time. She has noticed some increase in dark skin spots around hairs and pores on the skin of both arms and some on the chest. She denies any problems with shortness of breath, chest pain, leg swelling.  10/29/20 [name] is a 60 y.o. female here for scleroderma. She takes hydroxychloroquine '400mg'$  daily and prednisone '10mg'$  daily for arthritis symptoms currently.  She was initially diagnosed by Dr. Scarlette Shorts since about 2 years ago based on arthritis, skin changes, and high positive anti centromere Abs. Her arthritis involves multiple joints including bilateral shoulders, hands, knees, feet, and myalgias of the thighs. She lacks typical raynaud's symptoms or scleroderma related skin thickening of the hands but does have changes with skin discoloration on her arms and legs.   She sees ophthalmology in [place] and had cataract surgery about 6 months ago.  She reports up to 40 lb weight gain since this problem started. She has had an echocardiogram apparently no significant problems identified. She has had PFTs consistent with mild restrictive defect.  Review of Systems  Constitutional:  Positive for fatigue.  HENT:  Negative for mouth sores, mouth dryness and nose dryness.   Eyes:  Positive for dryness. Negative for pain, itching and visual disturbance.  Respiratory:  Negative for cough, hemoptysis, shortness of breath and difficulty breathing.   Cardiovascular:  Positive for swelling in legs/feet. Negative for chest pain and palpitations.  Gastrointestinal:  Positive for constipation. Negative for abdominal pain, blood in stool and diarrhea.  Endocrine: Negative for increased urination.  Genitourinary:  Negative for painful urination.  Musculoskeletal:  Positive for joint pain, joint pain, joint swelling, myalgias, muscle weakness, muscle tenderness and myalgias. Negative for morning stiffness.  Skin:  Positive for rash. Negative for color change and redness.  Allergic/Immunologic: Negative for susceptible to infections.  Neurological:  Negative for dizziness, numbness, headaches, memory loss and weakness.  Hematological:  Negative for swollen glands.  Psychiatric/Behavioral:  Negative for confusion and sleep disturbance.    PMFS History:  Patient Active Problem List   Diagnosis Date Noted   Proteinuria 05/03/2021   Limited systemic sclerosis ([place]) 10/29/2020   Rash and other nonspecific skin eruption 10/29/2020   Chronic arthralgias of knees and hips 10/29/2020   Peripheral neuropathy 10/29/2020  Right upper quadrant pain 07/09/2014    Past Medical History:  Diagnosis Date   Anxiety    Arthritis    Cataract    Depression    Fibroid    Fibromyalgia    Hematoma    left buttocks due to fall/ from 2 years ago (2018)   Hypercholesterolemia    Hypertension    Neuropathy    Scleroderma ([place])    Sleep apnea     Family History   Adopted: Yes  Problem Relation Age of Onset   Colon cancer Father 22   Alzheimer's disease Father    Thyroid disease Sister    Diabetes Sister    Liver disease Neg Hx    Pancreatic disease Neg Hx    Past Surgical History:  Procedure Laterality Date   ABDOMINAL SURGERY  1997   CATARACT EXTRACTION W/PHACO Right 03/22/2020   Procedure: CATARACT EXTRACTION PHACO AND INTRAOCULAR LENS PLACEMENT ([place]);  Surgeon: Baruch Goldmann, MD;  Location: [facility];  Service: Ophthalmology;  Laterality: Right;  CDE: 5.94   CATARACT EXTRACTION W/PHACO Left 04/05/2020   Procedure: CATARACT EXTRACTION PHACO AND INTRAOCULAR LENS PLACEMENT LEFT EYE;  Surgeon: Baruch Goldmann, MD;  Location: [facility];  Service: Ophthalmology;  Laterality: Left;  CDE: 5.79   CHOLECYSTECTOMY N/A 07/20/2014   Procedure: LAPAROSCOPIC CHOLECYSTECTOMY;  Surgeon: Jamesetta So, MD;  Location: [facility];  Service: General;  Laterality: N/A;   EYE SURGERY Left    45 yrs ago- eye "went out of focus". x2   MYOMECTOMY     Women's- 15 yrs ago   Social History   Social History Narrative   Not on file   Immunization History  Administered Date(s) Administered   [employer] Vaccination 12/09/2019, 01/06/2020   Tdap 10/20/2016     Objective: Vital Signs: BP 121/64 (BP Location: Right Arm, Patient Position: Sitting, Cuff Size: Normal)   Pulse 89   Ht '5\' 3"'$  (1.6 m)   Wt 203 lb 12.8 oz (92.4 kg)   BMI 36.10 kg/m    Physical Exam Constitutional:      Appearance: She is obese.  Cardiovascular:     Rate and Rhythm: Normal rate and regular rhythm.  Pulmonary:     Effort: Pulmonary effort is normal.     Breath sounds: Normal breath sounds.  Skin:    General: Skin is warm and dry.     Comments: Linear excoriations over bilateral shins with some confluent hyperpigmented patches distally, no palpable skin thickening or induration, no digital or nail pitting  Neurological:     Mental Status: She is alert.  Psychiatric:        Mood  and Affect: Mood normal.     Musculoskeletal Exam:  Shoulders full ROM no tenderness or swelling Elbows full ROM no tenderness or swelling Wrists full ROM no tenderness or swelling Fingers full ROM no tenderness or swelling Knees full ROM no tenderness or swelling Ankles full ROM no tenderness or swelling MTPs full ROM no tenderness or swelling   Investigation: No additional findings.  Imaging: No results found.  Recent Labs: Lab Results  Component Value Date   WBC 9.0 01/28/2021   HGB 13.5 01/28/2021   PLT 254 01/28/2021   NA 141 01/28/2021   K 3.9 01/28/2021   CL 102 01/28/2021   CO2 29 01/28/2021   GLUCOSE 72 01/28/2021   BUN 9 01/28/2021   CREATININE 0.79 01/28/2021   BILITOT 0.4 01/28/2021   ALKPHOS 98 07/17/2014   AST 14  01/28/2021   ALT 16 01/28/2021   PROT 6.5 01/28/2021   ALBUMIN 4.2 07/17/2014   CALCIUM 9.1 01/28/2021   GFRAA 94 01/28/2021    Speciality Comments: PLQ Eye Exam normal My Eye Dr. f/u 12 months  Procedures:  No procedures performed Allergies: Codeine and Tetracyclines & related   Assessment / Plan:     Visit Diagnoses: Limited systemic sclerosis ([place]) - Plan: predniSONE (DELTASONE) 5 MG tablet, hydroxychloroquine (PLAQUENIL) 200 MG tablet, Ambulatory referral to Dermatology  Continued skin itching and discolorations with her positive serology may be very limited systemic sclerosis if she does not have typical and crest syndrome constellation of symptoms either.  Recommend continuing hydroxychloroquine 400 mg daily.  Recommend discontinuing the low-dose prednisone, if she experiences symptom worsening and taking half dose 2.5 mg daily.  Refer to dermatology for further management of the ongoing skin rashes and itching.  Proteinuria, unspecified type - Plan: Protein / creatinine ratio, urine Previous urinalysis with some protein but did not have quantified amount.  We will check urine protein creatinine ratio.  Rash and other nonspecific  skin eruption   Prescribed hydroxyzine 25 mg as needed for skin itching otherwise continue topical therapies and systemic treatment as above.   Orders: Orders Placed This Encounter  Procedures   Protein / creatinine ratio, urine   Ambulatory referral to Dermatology    Meds ordered this encounter  Medications   predniSONE (DELTASONE) 5 MG tablet    Sig: Take 0.5 tablets (2.5 mg total) by mouth daily.    Dispense:  30 tablet    Refill:  0   hydroxychloroquine (PLAQUENIL) 200 MG tablet    Sig: Take 1 tablet (200 mg total) by mouth 2 (two) times daily.    Dispense:  180 tablet    Refill:  1   hydrOXYzine (ATARAX/VISTARIL) 50 MG tablet    Sig: Take 1 tablet (50 mg total) by mouth 3 (three) times daily as needed for itching.    Dispense:  30 tablet    Refill:  6      Follow-Up Instructions: Return in about 6 months (around 11/03/2021) for Ltd SSc HCQ f/u 17mo.   CCollier Salina MD  Note - This record has been created using [employer]  Chart creation errors have been sought, but may not always  have been located. Such creation errors do not reflect on  the standard of medical care.

## 2021-05-03 ENCOUNTER — Other Ambulatory Visit: Payer: Self-pay

## 2021-05-03 ENCOUNTER — Ambulatory Visit: Payer: 59 | Admitting: Internal Medicine

## 2021-05-03 ENCOUNTER — Encounter: Payer: Self-pay | Admitting: Internal Medicine

## 2021-05-03 VITALS — BP 121/64 | HR 89 | Ht 63.0 in | Wt 203.8 lb

## 2021-05-03 DIAGNOSIS — M25561 Pain in right knee: Secondary | ICD-10-CM

## 2021-05-03 DIAGNOSIS — M349 Systemic sclerosis, unspecified: Secondary | ICD-10-CM

## 2021-05-03 DIAGNOSIS — R768 Other specified abnormal immunological findings in serum: Secondary | ICD-10-CM | POA: Diagnosis not present

## 2021-05-03 DIAGNOSIS — G8929 Other chronic pain: Secondary | ICD-10-CM

## 2021-05-03 DIAGNOSIS — R21 Rash and other nonspecific skin eruption: Secondary | ICD-10-CM

## 2021-05-03 DIAGNOSIS — R809 Proteinuria, unspecified: Secondary | ICD-10-CM | POA: Diagnosis not present

## 2021-05-03 DIAGNOSIS — M25551 Pain in right hip: Secondary | ICD-10-CM

## 2021-05-03 DIAGNOSIS — M25552 Pain in left hip: Secondary | ICD-10-CM

## 2021-05-03 DIAGNOSIS — M25562 Pain in left knee: Secondary | ICD-10-CM

## 2021-05-03 MED ORDER — HYDROXYCHLOROQUINE SULFATE 200 MG PO TABS
200.0000 mg | ORAL_TABLET | Freq: Two times a day (BID) | ORAL | 1 refills | Status: DC
Start: 2021-05-03 — End: 2022-02-06

## 2021-05-03 MED ORDER — HYDROXYZINE HCL 50 MG PO TABS: 50.0000 mg | ORAL_TABLET | Freq: Three times a day (TID) | ORAL | 6 refills | Status: DC | PRN

## 2021-05-03 MED ORDER — PREDNISONE 5 MG PO TABS: 2.5000 mg | ORAL_TABLET | Freq: Every day | ORAL | 0 refills | Status: DC

## 2021-05-03 NOTE — Patient Instructions (Signed)
I have reordered the plaquenil and hydroxyzine to continue these medications as before. I recommend trying to taper the prednisone can go to 2.5 mg daily for at least 2 weeks and try to discontinue this medicine if doing okay with this. If symptoms worsen noticeably you may go back to the previous dose.  I am referring you to dermatology you should hear something about scheduling for this within about a week, if not please let us know.

## 2021-05-04 LAB — PROTEIN / CREATININE RATIO, URINE
Creatinine, Urine: 99 mg/dL (ref 20–275)
Protein/Creat Ratio: 111 mg/g creat (ref 21–161)
Protein/Creatinine Ratio: 0.111 mg/mg creat (ref 0.021–0.161)
Total Protein, Urine: 11 mg/dL (ref 5–24)

## 2021-05-04 NOTE — Progress Notes (Signed)
Urine test looks fine with no protein, can continue medications as planned.

## 2021-06-19 NOTE — Progress Notes (Signed)
Office Visit Note  Patient: Kristine Young             Date of Birth: June 07, 1961           MRN: [id number]             PCP: Monico Blitz, MD Referring: Monico Blitz, MD Visit Date: 06/20/2021   Subjective:  Follow-up (Patient is taking PLQ 200 mg twice daily and Prednisone 2.5 mg daily. Patient has noticed increased pain in low back and bilateral lower extremities.)   History of Present Illness: Kristine Young is a 60 y.o. female here for follow up for limited systemic sclerosis on HCQ 400 mg PO daily. She had taken prednisone 5 mg daily recommended to taper off this since our last follow up.  She decrease the prednisone to 2.5 mg daily without any immediate problems but in the past several weeks she has felt increased pain in multiple areas particularly in the back and bilateral hips.  She has noticed some sensation of skin tightness and itching over her distal legs.   Previous HPI 05/03/21 Kristine Young is a 60 y.o. 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  She feels the symptoms are overall pretty well controlled.  Biggest complaint is areas of skin itching and hyperpigmentation especially in linear distributions down her legs.  She treats these locally with topical emollients.   10/29/20 Kristine Young is a 60 y.o. female here for scleroderma. She takes hydroxychloroquine '400mg'$  daily and prednisone '10mg'$  daily for arthritis symptoms currently.  She was initially diagnosed by Dr. Scarlette Shorts since about 2 years ago based on arthritis, skin changes, and high positive anti centromere Abs. Her arthritis involves multiple joints including bilateral shoulders, hands, knees, feet, and myalgias of the thighs. She lacks typical raynaud's symptoms or scleroderma related skin thickening of the hands but does have changes with skin discoloration on her arms and legs.   She sees ophthalmology in [place] and had  cataract surgery about 6 months ago. She reports up to 40 lb weight gain since this problem started. She has had an echocardiogram apparently no significant problems identified. She has had PFTs consistent with mild restrictive defect.   Review of Systems  Constitutional:  Positive for fatigue.  HENT:  Negative for mouth sores, mouth dryness and nose dryness.   Eyes:  Positive for dryness. Negative for pain, itching and visual disturbance.  Respiratory:  Negative for cough, hemoptysis, shortness of breath and difficulty breathing.   Cardiovascular:  Negative for chest pain, palpitations and swelling in legs/feet.  Gastrointestinal:  Positive for constipation. Negative for abdominal pain, blood in stool and diarrhea.  Endocrine: Negative for increased urination.  Genitourinary:  Negative for painful urination.  Musculoskeletal:  Positive for joint pain, joint pain, morning stiffness and muscle tenderness. Negative for joint swelling, myalgias, muscle weakness and myalgias.  Skin:  Positive for rash. Negative for color change and redness.  Allergic/Immunologic: Negative for susceptible to infections.  Neurological:  Positive for headaches and weakness. Negative for dizziness, numbness and memory loss.  Hematological:  Negative for swollen glands.  Psychiatric/Behavioral:  Negative for confusion and sleep disturbance.    PMFS History:  Patient Active Problem List   Diagnosis Date Noted   Low back pain 06/20/2021   Proteinuria 05/03/2021   Limited systemic sclerosis ([place]) 10/29/2020   Rash and other nonspecific skin eruption 10/29/2020   Chronic arthralgias  of knees and hips 10/29/2020   Peripheral neuropathy 10/29/2020   Right upper quadrant pain 07/09/2014    Past Medical History:  Diagnosis Date   Anxiety    Arthritis    Cataract    Depression    Fibroid    Fibromyalgia    Hematoma    left buttocks due to fall/ from 2 years ago (2018)   Hypercholesterolemia    Hypertension     Neuropathy    Scleroderma ([place])    Sleep apnea     Family History  Adopted: Yes  Problem Relation Age of Onset   Colon cancer Father 81   Alzheimer's disease Father    Thyroid disease Sister    Diabetes Sister    Liver disease Neg Hx    Pancreatic disease Neg Hx    Past Surgical History:  Procedure Laterality Date   ABDOMINAL SURGERY  1997   CATARACT EXTRACTION W/PHACO Right 03/22/2020   Procedure: CATARACT EXTRACTION PHACO AND INTRAOCULAR LENS PLACEMENT ([place]);  Surgeon: Baruch Goldmann, MD;  Location: [facility];  Service: Ophthalmology;  Laterality: Right;  CDE: 5.94   CATARACT EXTRACTION W/PHACO Left 04/05/2020   Procedure: CATARACT EXTRACTION PHACO AND INTRAOCULAR LENS PLACEMENT LEFT EYE;  Surgeon: Baruch Goldmann, MD;  Location: [facility];  Service: Ophthalmology;  Laterality: Left;  CDE: 5.79   CHOLECYSTECTOMY N/A 07/20/2014   Procedure: LAPAROSCOPIC CHOLECYSTECTOMY;  Surgeon: Jamesetta So, MD;  Location: [facility];  Service: General;  Laterality: N/A;   EYE SURGERY Left    45 yrs ago- eye "went out of focus". x2   MYOMECTOMY     Women's- 15 yrs ago   Social History   Social History Narrative   Not on file   Immunization History  Administered Date(s) Administered   [employer] Vaccination 12/09/2019, 01/06/2020   Tdap 10/20/2016     Objective: Vital Signs: BP 123/78 (BP Location: Left Arm, Patient Position: Sitting, Cuff Size: Large)   Pulse 82   Ht 5' 2.5" (1.588 m)   Wt 204 lb 9.6 oz (92.8 kg)   BMI 36.83 kg/m    Physical Exam Constitutional:      Appearance: She is obese.  Cardiovascular:     Rate and Rhythm: Normal rate and regular rhythm.  Pulmonary:     Effort: Pulmonary effort is normal.     Breath sounds: Normal breath sounds.  Skin:    General: Skin is warm and dry.     Comments: Scattered areas on arms and upper chest area with some patchy hypopigmentation sparing around perifollicular areas Areas of linear hyperpigmentation on bilateral shins no  current edema or erythematous rashes  Neurological:     Mental Status: She is alert.  Psychiatric:        Mood and Affect: Mood normal.     Musculoskeletal Exam:  Shoulders full ROM no tenderness or swelling Elbows full ROM no tenderness or swelling Wrists full ROM no tenderness or swelling Fingers full ROM no tenderness or swelling Low back midline and bilateral tenderness to palpation extending from lower thoracic spine down to sacrum Hip internal rotation, pace maneuver, and Faber maneuver provoke anterior hip pain but with preserved range of motion, lateral hip tenderness to palpation but not provoked with movement Knees full ROM no tenderness or swelling, patellofemoral crepitus present Ankles full ROM no tenderness or swelling    Investigation: No additional findings.  Imaging: XR HIPS BILAT W OR W/O PELVIS 3-4 VIEWS  Result Date: 06/21/2021 X-ray hips and  pelvis 4 views Femoral acetabular joint space appears normal bilaterally with no significant degenerative arthritis changes no sclerosis or erosions.  Visualized portion of the lumbar spine appears intact with no significant abnormality.  Mild subchondral sclerosis on bilateral SI joints with some marginal osteophyte formation visible at the right side. Impression No significant abnormality in bilateral hip joints, his bilateral SI joint arthritis favor degenerative disease with marginal osteophytes and no clear erosions seen  XR Lumbar Spine 2-3 Views  Result Date: 06/21/2021 X-ray lumbar spine 2 views Lumbar spine intervertebral disc spaces appear well-preserved no significant visible spondylolisthesis.  Anterior endplate osteophyte formation most prominent above the level of L2 throughout visualized thoracic spine.  No acute bony abnormality seen. 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 f/u 12 months  Procedures:  No procedures performed Allergies: Codeine and Tetracyclines & related   Assessment / Plan:     Visit Diagnoses: Limited systemic sclerosis ([place]) - Plan: XR Lumbar Spine 2-3 Views, XR HIPS BILAT W OR W/O PELVIS 3-4 VIEWS, Ambulatory referral to Dermatology, predniSONE (DELTASONE) 5 MG tablet, Sedimentation rate, C-reactive protein  Difficult to say whether current increasing complaints is related to withdrawal of steroids causing disease activity.  She does have some skin changes but not very severe.  Distribution of joint pains is unusual and worsening with use related may space more suspicious for degenerative or muscular cause of pain than active inflammation.  Plan to continue hydroxychloroquine along with trial of short duration increase steroid taper.  Chronic arthralgias of knees and hips - Plan: XR Lumbar Spine 2-3 Views, XR HIPS BILAT W OR W/O PELVIS 3-4 VIEWS, Sedimentation rate, C-reactive protein  Checking lumbar spine and hip x-rays.  We will repeat sedimentation rate and CRP for evidence of inflammatory activity.  Depending on findings may also be candidate for treatment such as physical therapy.    Rash and other nonspecific skin eruption - Plan: predniSONE (DELTASONE) 5 MG tablet    For the increased skin itching and pigmentation changes she would benefit from a dermatology evaluation will refer to Dr. Sharol Roussel for history of limited cutaneous systemic sclerosis.  Orders: Orders Placed This  Encounter  Procedures   XR Lumbar Spine 2-3 Views   XR HIPS BILAT W OR W/O PELVIS 3-4 VIEWS   Sedimentation rate   C-reactive protein   Ambulatory referral to Dermatology    Meds ordered this encounter  Medications   predniSONE (DELTASONE) 5 MG tablet    Sig: Take 6 tablets (30 mg total) by mouth daily for 1 day, THEN 5 tablets (25 mg total) daily for 1 day, THEN 4 tablets (20 mg total) daily for 1 day, THEN 3 tablets (15 mg total) daily for 1 day, THEN 2 tablets (10 mg total) daily for 1 day,  THEN 1 tablet (5 mg total) daily.    Dispense:  30 tablet    Refill:  0      Follow-Up Instructions: No follow-ups on file.   Collier Salina, MD  Note - This record has been created using [employer].  Chart creation errors have been sought, but may not always  have been located. Such creation errors do not reflect on  the standard of medical care.

## 2021-06-20 ENCOUNTER — Ambulatory Visit: Payer: Self-pay

## 2021-06-20 ENCOUNTER — Other Ambulatory Visit: Payer: Self-pay

## 2021-06-20 ENCOUNTER — Encounter: Payer: Self-pay | Admitting: Internal Medicine

## 2021-06-20 ENCOUNTER — Ambulatory Visit: Payer: 59 | Admitting: Internal Medicine

## 2021-06-20 VITALS — BP 123/78 | HR 82 | Ht 62.5 in | Wt 204.6 lb

## 2021-06-20 DIAGNOSIS — M545 Low back pain, unspecified: Secondary | ICD-10-CM | POA: Insufficient documentation

## 2021-06-20 DIAGNOSIS — G8929 Other chronic pain: Secondary | ICD-10-CM

## 2021-06-20 DIAGNOSIS — M25552 Pain in left hip: Secondary | ICD-10-CM | POA: Diagnosis not present

## 2021-06-20 DIAGNOSIS — R21 Rash and other nonspecific skin eruption: Secondary | ICD-10-CM

## 2021-06-20 DIAGNOSIS — M349 Systemic sclerosis, unspecified: Secondary | ICD-10-CM | POA: Diagnosis not present

## 2021-06-20 DIAGNOSIS — M5459 Other low back pain: Secondary | ICD-10-CM

## 2021-06-20 DIAGNOSIS — M25551 Pain in right hip: Secondary | ICD-10-CM | POA: Diagnosis not present

## 2021-06-20 DIAGNOSIS — M25561 Pain in right knee: Secondary | ICD-10-CM

## 2021-06-20 DIAGNOSIS — M25562 Pain in left knee: Secondary | ICD-10-CM

## 2021-06-20 MED ORDER — PREDNISONE 5 MG PO TABS: ORAL_TABLET | ORAL | 0 refills | Status: AC

## 2021-06-21 LAB — C-REACTIVE PROTEIN: CRP: 1.5 mg/L (ref <8.0)

## 2021-06-21 LAB — SEDIMENTATION RATE: Sed Rate: 2 mm/h (ref 0–30)

## 2021-06-30 ENCOUNTER — Telehealth: Payer: Self-pay | Admitting: Internal Medicine

## 2021-06-30 NOTE — Telephone Encounter (Signed)
Patient had UA, and xrays done 3 weeks ago. Patient has not received a call for those results. Please call patient to advise.

## 2021-07-01 NOTE — Telephone Encounter (Signed)
FYI- I spoke with Kristine Young her results look good no evidence of increased disease activity. I don't have a urinalysis results she apparently left a specimen but was not ordered correctly. Xrays showing degenerative arthritis changes in pelvis and lumbar spine joints. Her dermatology referral not scheduled yet most recently records were re-sent on 9/20.

## 2021-08-29 ENCOUNTER — Other Ambulatory Visit: Payer: Self-pay

## 2021-08-29 MED ORDER — PREDNISONE 5 MG PO TABS: 5.0000 mg | ORAL_TABLET | Freq: Every day | ORAL | 2 refills | Status: DC

## 2021-08-29 NOTE — Telephone Encounter (Signed)
Per Dr. Benjamine Mola, patient should follow up in about 3 months from last visit, expectation is to be after dermatology visit. Please schedule patient for a follow up visit.

## 2021-08-29 NOTE — Telephone Encounter (Addendum)
Okay to refill at 5 mg (1 tablet) per day dose. She should follow up in about 3 months from last visit, expectation is to be after dermatology visit.

## 2021-08-29 NOTE — Addendum Note (Signed)
Addended by: Carole Binning on: 08/29/2021 01:27 PM   Modules accepted: Orders

## 2021-08-29 NOTE — Telephone Encounter (Signed)
Next Visit: No follow-ups on file. PLEASE ADVISE  Last Visit: 06/20/2021,   Last Fill: 06/25/2021  Dx:  Limited systemic sclerosis   Current Dose per office note on prednisone: Take 6 tablets (30 mg total) by mouth daily for 1 day, THEN 5 tablets (25 mg total) daily for 1 day, THEN 4 tablets (20 mg total) daily for 1 day, THEN 3 tablets (15 mg total) daily for 1 day, THEN 2 tablets (10 mg total) daily for 1 day, THEN 1 tablet (5 mg total) daily  Okay to refill prednisone?

## 2021-08-29 NOTE — Telephone Encounter (Signed)
Patient called requesting prescription refill of Prednisone 5 mg to be sent to [facility] in [place].

## 2021-08-29 NOTE — Telephone Encounter (Signed)
FYI: I spoke with patient in regards to scheduling a follow up appointment. Per patient, she has not scheduled an appointment with the dermatologist yet. She will call then to schedule, and call us back to schedule an appointment after that.

## 2021-08-30 ENCOUNTER — Telehealth: Payer: Self-pay

## 2021-08-30 NOTE — Telephone Encounter (Signed)
Referral sent to [facility]

## 2021-08-30 NOTE — Telephone Encounter (Signed)
Patient called stating she had a referral from Dr. Benjamine Mola to schedule an appointment with the dermatologist at [facility].  Patient states they don't take her insurance [employer].  Patient needs a referral to a dermatologist in the [facility] system or [facility].

## 2021-11-29 ENCOUNTER — Telehealth: Payer: Self-pay | Admitting: Internal Medicine

## 2021-11-29 NOTE — Telephone Encounter (Signed)
Patient called the office stating the dermatologist that Dr. Benjamine Mola sent her to is not in-network with her insurance. Patient states she knows one that is in network and would like a referral to [facility] in [place].

## 2021-11-29 NOTE — Telephone Encounter (Signed)
Referral changed and faxed, pending appt

## 2021-11-30 ENCOUNTER — Telehealth: Payer: Self-pay | Admitting: Internal Medicine

## 2021-11-30 NOTE — Telephone Encounter (Signed)
[facility] called the office stating we had referred the patient to them but the referral did not have a diagnosis or why she was referred.

## 2021-11-30 NOTE — Telephone Encounter (Signed)
Spoke with Ariel at Dr. Tarri Glenn office and advised diagnosis is Limited systemic sclerosis.

## 2021-12-01 ENCOUNTER — Telehealth: Payer: Self-pay

## 2021-12-01 NOTE — Telephone Encounter (Signed)
Patient called stating she received a call from [facility] to schedule an appointment and was told the diagnosis was limited systemic sclerosis.  Patient states that is the first time she has heard that diagnosis and requested a return call ASAP.

## 2021-12-05 NOTE — Telephone Encounter (Signed)
Left VM to call back. This diagnosis is based on the problem we are seeing her for and same as previously Dr. Scarlette Shorts. This is basically the same thing as scleroderma just a more specific name. She has most localized symptoms but has some systemic features as well including joint pains

## 2021-12-06 NOTE — Telephone Encounter (Signed)
Patient advised this diagnosis is based on the problem we are seeing her for and same as previously Dr. Scarlette Shorts. This is basically the same thing as scleroderma just a more specific name. She has most localized symptoms but has some systemic features as well including joint pains.

## 2021-12-21 ENCOUNTER — Other Ambulatory Visit: Payer: Self-pay | Admitting: Internal Medicine

## 2021-12-21 DIAGNOSIS — Z1231 Encounter for screening mammogram for malignant neoplasm of breast: Secondary | ICD-10-CM

## 2022-01-24 ENCOUNTER — Other Ambulatory Visit: Payer: Self-pay | Admitting: Internal Medicine

## 2022-02-05 NOTE — Progress Notes (Signed)
? ?Office Visit Note ? ?Patient: Kristine Young             ?Date of Birth: [birth year]           ?MRN: [id number]             ?PCP: Monico Blitz, MD ?Referring: Monico Blitz, MD ?Visit Date: 02/06/2022 ? ? ?Subjective:  ?Follow-up ([place]) ? ? ?History of Present Illness: Kristine Young is a 61 y.o. female here for follow up for limited systemic sclerosis with cutaneous symptoms on HCQ 400 mg daily and prednisone 5 mg daily attempted to taper steroid dose. Doing okay overall she still has itching on her leg and foot without new visible skin changes. She has noticed an area on the right side of her neck starting to bother her and itching. She had bronchitis recently recovered had a lot of cough and shortness of breath. Also developed throat pain and choking on some foods and liquids, worst was a piece of hamburger. Started on pantoprazole for GERD for this.  ? ?Previous HPI ?06/20/21 ?Kristine Young is a 61 y.o. female here for follow up for limited systemic sclerosis on HCQ 400 mg PO daily. She had taken prednisone 5 mg daily recommended to taper off this since our last follow up.  She decrease the prednisone to 2.5 mg daily without any immediate problems but in the past several weeks she has felt increased pain in multiple areas particularly in the back and bilateral hips.  She has noticed some sensation of skin tightness and itching over her distal legs.  ?  ?Previous HPI  ?10/29/20 ?Kristine Young is a 61 y.o. female here for scleroderma. She takes hydroxychloroquine '400mg'$  daily and prednisone '10mg'$  daily for arthritis symptoms currently.  She was initially diagnosed by Dr. Scarlette Shorts since about 2 years ago based on arthritis, skin changes, and high positive anti centromere Abs. Her arthritis involves multiple joints including bilateral shoulders, hands, knees, feet, and myalgias of the thighs. She lacks typical raynaud's symptoms or scleroderma related skin thickening of the hands but does have changes with skin  discoloration on her arms and legs. ?  ?She sees ophthalmology in [place] and had cataract surgery about 6 months ago. She reports up to 40 lb weight gain since this problem started. She has had an echocardiogram apparently no significant problems identified. She has had PFTs consistent with mild restrictive defect. ? ? ? ?Review of Systems  ?Constitutional:  Positive for fatigue.  ?HENT:  Negative for mouth dryness.   ?Eyes:  Negative for dryness.  ?Respiratory:  Negative for shortness of breath.   ?Cardiovascular:  Negative for swelling in legs/feet.  ?Gastrointestinal:  Positive for constipation.  ?Endocrine: Positive for heat intolerance.  ?Genitourinary:  Negative for difficulty urinating.  ?Musculoskeletal:  Positive for joint pain, joint pain and morning stiffness.  ?Skin:  Negative for rash.  ?Allergic/Immunologic: Negative for susceptible to infections.  ?Neurological:  Positive for numbness.  ?Hematological:  Negative for bruising/bleeding tendency.  ?Psychiatric/Behavioral:  Positive for sleep disturbance.   ? ?PMFS History:  ?Patient Active Problem List  ? Diagnosis Date Noted  ? Low back pain 06/20/2021  ? Proteinuria 05/03/2021  ? Limited systemic sclerosis ([place]) 10/29/2020  ? Rash and other nonspecific skin eruption 10/29/2020  ? Chronic arthralgias of knees and hips 10/29/2020  ? Peripheral neuropathy 10/29/2020  ? Right upper quadrant pain 07/09/2014  ?  ?Past Medical History:  ?Diagnosis Date  ? Anxiety   ? Arthritis   ?  Cataract   ? Depression   ? Fibroid   ? Fibromyalgia   ? Hematoma   ? left buttocks due to fall/ from 2 years ago (2018)  ? Hypercholesterolemia   ? Hypertension   ? Neuropathy   ? Scleroderma ([place])   ? Sleep apnea   ?  ?Family History  ?Adopted: Yes  ?Problem Relation Age of Onset  ? Colon cancer Father 64  ? Alzheimer's disease Father   ? Thyroid disease Sister   ? Diabetes Sister   ? Liver disease Neg Hx   ? Pancreatic disease Neg Hx   ? ?Past Surgical History:  ?Procedure  Laterality Date  ? ABDOMINAL SURGERY  1997  ? CATARACT EXTRACTION W/PHACO Right 03/22/2020  ? Procedure: CATARACT EXTRACTION PHACO AND INTRAOCULAR LENS PLACEMENT ([facility]);  Surgeon: Baruch Goldmann, MD;  Location: [facility];  Service: Ophthalmology;  Laterality: Right;  CDE: 5.94  ? CATARACT EXTRACTION W/PHACO Left 04/05/2020  ? Procedure: CATARACT EXTRACTION PHACO AND INTRAOCULAR LENS PLACEMENT LEFT EYE;  Surgeon: Baruch Goldmann, MD;  Location: [facility];  Service: Ophthalmology;  Laterality: Left;  CDE: 5.79  ? CHOLECYSTECTOMY N/A 07/20/2014  ? Procedure: LAPAROSCOPIC CHOLECYSTECTOMY;  Surgeon: Jamesetta So, MD;  Location: [facility];  Service: General;  Laterality: N/A;  ? EYE SURGERY Left   ? 45 yrs ago- eye "went out of focus". x2  ? MYOMECTOMY    ? Women's- 15 yrs ago  ? ?Social History  ? ?Social History Narrative  ? Not on file  ? ?Immunization History  ?Administered Date(s) Administered  ? Moderna Sars-Covid-2 Vaccination 12/09/2019, 01/06/2020  ? Tdap 10/20/2016  ?  ? ?Objective: ?Vital Signs: BP (!) 144/80 (BP Location: Left Arm, Patient Position: Sitting, Cuff Size: Normal)   Pulse 82   Resp 16   Ht 5' 2.5" (1.588 m)   Wt 193 lb (87.5 kg)   BMI 34.74 kg/m?   ? ?Physical Exam ?Constitutional:   ?   Appearance: She is obese.  ?Cardiovascular:  ?   Rate and Rhythm: Normal rate and regular rhythm.  ?Pulmonary:  ?   Effort: Pulmonary effort is normal.  ?   Breath sounds: Normal breath sounds.  ?Musculoskeletal:  ?   Right lower leg: No edema.  ?   Left lower leg: No edema.  ?Skin: ?   General: Skin is warm and dry.  ?   Findings: Rash present.  ?   Comments: Tortuous nailfold capillaries ?Right anterior shin hyperpigmentation ?Well healing foot skin biopsy site ?No visible change on neck around itching area  ?Neurological:  ?   Mental Status: She is alert.  ?Psychiatric:     ?   Mood and Affect: Mood normal.  ?  ? ?Musculoskeletal Exam:  ?Shoulders full ROM no tenderness or swelling ?Left elbow restricted flexion and  extension ROM, no tenderness or swelling ?Wrists full ROM no tenderness or swelling ?Fingers full ROM no tenderness or swelling ?Knees full ROM no tenderness or swelling ?Ankles full ROM no tenderness or swelling ? ? ?Investigation: ?No additional findings. ? ?Imaging: ?No results found. ? ?Recent Labs: ?Lab Results  ?Component Value Date  ? WBC 9.0 01/28/2021  ? HGB 13.5 01/28/2021  ? PLT 254 01/28/2021  ? NA 141 01/28/2021  ? K 3.9 01/28/2021  ? CL 102 01/28/2021  ? CO2 29 01/28/2021  ? GLUCOSE 72 01/28/2021  ? BUN 9 01/28/2021  ? CREATININE 0.79 01/28/2021  ? BILITOT 0.4 01/28/2021  ? ALKPHOS 98 07/17/2014  ?  AST 14 01/28/2021  ? ALT 16 01/28/2021  ? PROT 6.5 01/28/2021  ? ALBUMIN 4.2 07/17/2014  ? CALCIUM 9.1 01/28/2021  ? GFRAA 94 01/28/2021  ? ? ?Speciality Comments: PLQ Eye Exam 03/17/2021 normal My [facility] Dr. f/u 12 months ? ?Procedures:  ?No procedures performed ?Allergies: Codeine and Tetracyclines & related  ? ?Assessment / Plan:     ?Visit Diagnoses: Limited systemic sclerosis ([place]) - Plan: hydroxychloroquine (PLAQUENIL) 200 MG tablet, predniSONE (DELTASONE) 2.5 MG tablet, Ambulatory referral to Gastroenterology ? ?Appears to be doing well, she is interested in tapering down steroid dose. Plan to continue HCQ 400 mg daily and decrease prednisone to 2.5 mg daily. If symptoms worsen with tapering would consider add on DMARD such as methotrexate. Reported choking on food and increased GERD concerning if there is dysmotility. She reports never saw GI I will refer for this she could benefit with screening or more in depth exam. ? ?Rash and other nonspecific skin eruption - Plan: hydroxychloroquine (PLAQUENIL) 200 MG tablet, predniSONE (DELTASONE) 2.5 MG tablet ? ?I do not see obvious extension of involved area or visible changes on her neck. Recent dermatology exam and biopsy somewhat nonspecific reviewing records for this. She was prescribed topical medication that was not yet started. ? ? ?Orders: ?Orders Placed  This Encounter  ?Procedures  ? Ambulatory referral to Gastroenterology  ? ?Meds ordered this encounter  ?Medications  ? hydroxychloroquine (PLAQUENIL) 200 MG tablet  ?  Sig: Take 1 tablet (200 mg total) by mout

## 2022-02-06 ENCOUNTER — Ambulatory Visit: Payer: BC Managed Care – PPO | Admitting: Internal Medicine

## 2022-02-06 ENCOUNTER — Encounter: Payer: Self-pay | Admitting: Internal Medicine

## 2022-02-06 VITALS — BP 144/80 | HR 82 | Resp 16 | Ht 62.5 in | Wt 193.0 lb

## 2022-02-06 DIAGNOSIS — M349 Systemic sclerosis, unspecified: Secondary | ICD-10-CM | POA: Diagnosis not present

## 2022-02-06 DIAGNOSIS — R21 Rash and other nonspecific skin eruption: Secondary | ICD-10-CM | POA: Diagnosis not present

## 2022-02-06 MED ORDER — HYDROXYCHLOROQUINE SULFATE 200 MG PO TABS: 200.0000 mg | ORAL_TABLET | Freq: Two times a day (BID) | ORAL | 1 refills | Status: DC

## 2022-02-06 MED ORDER — PREDNISONE 2.5 MG PO TABS: 2.5000 mg | ORAL_TABLET | Freq: Every day | ORAL | 1 refills | Status: DC

## 2022-02-20 ENCOUNTER — Encounter: Payer: Self-pay | Admitting: Gastroenterology

## 2022-03-08 ENCOUNTER — Ambulatory Visit: Payer: 59 | Admitting: Dermatology

## 2022-03-21 ENCOUNTER — Ambulatory Visit: Payer: BC Managed Care – PPO | Admitting: Gastroenterology

## 2022-03-21 ENCOUNTER — Encounter: Payer: Self-pay | Admitting: Gastroenterology

## 2022-03-21 VITALS — BP 142/90 | HR 85 | Ht 62.0 in | Wt 194.0 lb

## 2022-03-21 DIAGNOSIS — R070 Pain in throat: Secondary | ICD-10-CM | POA: Diagnosis not present

## 2022-03-21 DIAGNOSIS — R1314 Dysphagia, pharyngoesophageal phase: Secondary | ICD-10-CM

## 2022-03-21 NOTE — Patient Instructions (Signed)
If you are age 62 or older, your body mass index should be between 23-30. Your Body mass index is 35.48 kg/m. If this is out of the aforementioned range listed, please consider follow up with your Primary Care Provider.  If you are age 53 or younger, your body mass index should be between 19-25. Your Body mass index is 35.48 kg/m. If this is out of the aformentioned range listed, please consider follow up with your Primary Care Provider.   ________________________________________________________  The [facility] providers would like to encourage you to use [facility] to communicate with providers for non-urgent requests or questions.  Due to long hold times on the telephone, sending your provider a message by [facility] may be a faster and more efficient way to get a response.  Please allow 48 business hours for a response.  Please remember that this is for non-urgent requests.  _______________________________________________________  Kristine Young will be contacted by [facility] in the next 2 days to arrange a Barium swallow.  The number on your caller ID will be [phone], please answer when they call.  If you have not heard from them in 2 days please call [phone] to schedule.     You have been scheduled for a Barium Esophogram at [facility] (1st floor of the hospital) on       at        . Please arrive 15 minutes prior to your appointment for registration. Make certain not to have anything to eat or drink 3 hours prior to your test. If you need to reschedule for any reason, please contact radiology at [phone] to do so. __________________________________________________________________ A barium swallow is an examination that concentrates on views of the esophagus. This tends to be a double contrast exam (barium and two liquids which, when combined, create a gas to distend the wall of the oesophagus) or single contrast (non-ionic iodine based). The study is usually tailored  to your symptoms so a good history is essential. Attention is paid during the study to the form, structure and configuration of the esophagus, looking for functional disorders (such as aspiration, dysphagia, achalasia, motility and reflux) EXAMINATION You may be asked to change into a gown, depending on the type of swallow being performed. A radiologist and radiographer will perform the procedure. The radiologist will advise you of the type of contrast selected for your procedure and direct you during the exam. You will be asked to stand, sit or lie in several different positions and to hold a small amount of fluid in your mouth before being asked to swallow while the imaging is performed .In some instances you may be asked to swallow barium coated marshmallows to assess the motility of a solid food bolus. The exam can be recorded as a digital or video fluoroscopy procedure. POST PROCEDURE It will take 1-2 days for the barium to pass through your system. To facilitate this, it is important, unless otherwise directed, to increase your fluids for the next 24-48hrs and to resume your normal diet.  This test typically takes about 30 minutes to perform. __________________________________________________________________________________

## 2022-03-21 NOTE — Progress Notes (Deleted)
[facility] Consult Note:  History: Kristine Young 03/21/2022  Referring provider: Monico Blitz, MD  Reason for consult/chief complaint: No chief complaint on file.   Subjective  HPI: Kristine Young was referred by her rheumatologist after recent visit there.  She is treated for limited systemic sclerosis with skin symptoms, and at last visit was maintained on Plaquenil and prednisone being tapered off.  She described dysphagia and was referred to [place] for further evaluation.  She was scheduled at screening colonoscopy with [place] in April 2020, and then it was canceled for COVID restrictions.  She has not had colon cancer screening and her father was diagnosed with colon cancer in his mid 24s. ***   ROS:  Review of Systems   Past Medical History: Past Medical History:  Diagnosis Date   Anxiety    Arthritis    Cataract    Depression    Fibroid    Fibromyalgia    Hematoma    left buttocks due to fall/ from 2 years ago (2018)   Hypercholesterolemia    Hypertension    Neuropathy    Scleroderma ([place])    Sleep apnea      Past Surgical History: Past Surgical History:  Procedure Laterality Date   ABDOMINAL SURGERY  1997   CATARACT EXTRACTION W/PHACO Right 03/22/2020   Procedure: CATARACT EXTRACTION PHACO AND INTRAOCULAR LENS PLACEMENT ([place]);  Surgeon: Baruch Goldmann, MD;  Location: [facility];  Service: Ophthalmology;  Laterality: Right;  CDE: 5.94   CATARACT EXTRACTION W/PHACO Left 04/05/2020   Procedure: CATARACT EXTRACTION PHACO AND INTRAOCULAR LENS PLACEMENT LEFT EYE;  Surgeon: Baruch Goldmann, MD;  Location: [facility];  Service: Ophthalmology;  Laterality: Left;  CDE: 5.79   CHOLECYSTECTOMY N/A 07/20/2014   Procedure: LAPAROSCOPIC CHOLECYSTECTOMY;  Surgeon: Jamesetta So, MD;  Location: [facility];  Service: General;  Laterality: N/A;   EYE SURGERY Left    45 yrs ago- eye "went out of focus". 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: [employer]   Tobacco Use   Smoking status: Never   Smokeless tobacco: Never  Vaping Use   Vaping Use: Never used  Substance and Sexual Activity   Alcohol use: No   Drug use: No   Sexual activity: Not Currently    Partners: Male    Birth control/protection: Post-menopausal  Other Topics Concern   Not on file  Social History Narrative   Not on file   Social Determinants of Health   Financial Resource Strain: Not on file  Food Insecurity: Not on file  Transportation Needs: Not on file  Physical Activity: Not on file  Stress: Not on file  Social Connections: Not on file    Allergies: Allergies  Allergen Reactions   Codeine Itching   Tetracyclines & Related  Tightens up stomach into a ball!    Outpatient Meds: Current Outpatient Medications  Medication Sig Dispense Refill   acetaminophen (TYLENOL) 500 MG tablet Take 1 tablet (500 mg total) by mouth every 6 (six) hours as needed. 30 tablet 0   cyclobenzaprine (FLEXERIL) 10 MG tablet Take 10 mg by mouth 3 (three) times daily as needed for muscle spasms.     DULoxetine (CYMBALTA) 60 MG capsule Take 60 mg by mouth 2 (two) times daily.      hydroxychloroquine (PLAQUENIL) 200 MG tablet Take 1 tablet (200 mg total) by mouth 2 (two) times daily. 180 tablet 1   hydrOXYzine (ATARAX/VISTARIL) 50 MG tablet Take 1 tablet (50 mg total) by mouth 3 (three) times daily as needed for itching. 30 tablet 6   lisinopril-hydrochlorothiazide  (PRINZIDE,ZESTORETIC) 10-12.5 MG per tablet Take 1 tablet by mouth daily.     pantoprazole (PROTONIX) 40 MG tablet Take by mouth.     predniSONE (DELTASONE) 2.5 MG tablet Take 1 tablet (2.5 mg total) by mouth daily with breakfast. 90 tablet 1   No current facility-administered medications for this visit.      ___________________________________________________________________ Objective   Exam:  There were no vitals taken for this visit. Wt Readings from Last 3 Encounters:  02/06/22 193 lb (87.5 kg)  06/20/21 204 lb 9.6 oz (92.8 kg)  05/03/21 203 lb 12.8 oz (92.4 kg)    General: ***  Eyes: sclera anicteric, no redness ENT: oral mucosa moist without lesions, no cervical or supraclavicular lymphadenopathy CV: ***, no JVD, no peripheral edema Resp: clear to auscultation bilaterally, normal RR and effort noted GI: soft, *** tenderness, with active bowel sounds. No guarding or palpable organomegaly noted. Skin; warm and dry, no rash or jaundice noted Neuro: awake, alert and oriented x 3. Normal gross motor function and fluent speech  Labs:  ***  Radiologic Studies:  ***  Assessment: No diagnosis found.  ***  Plan: EGD to evaluate dysphagia and colonoscopy for screening with a family history of colon cancer. ***  Thank you for the courtesy of this consult.  Please call me with any questions or concerns.  Nelida Meuse III  CC: Referring provider noted above

## 2022-03-21 NOTE — Progress Notes (Signed)
[facility] Consult Note:  History: Kristine Young 03/21/2022  Referring provider: Monico Blitz, MD  Reason for consult/chief complaint: Dysphagia (Pt been having problems swallowing for several months, pt is having problems swallowing burgers, pt feels likes she is swallowing over a lump )   Subjective  HPI: This is a 61 year old woman referred by her rheumatologist for recently reported dysphagia.  She has limited form of systemic sclerosis with skin findings on Plaquenil and prednisone that was recently being tapered.  She describes several months of dysphagia with a feeling of food getting stuck as if she is "swallowing overall lump". She was scheduled for a screening colonoscopy with [place] in April 2020 due to history of colon cancer in her father, but it was canceled for COVID.  [name] tells me that about 6 weeks ago she had a bronchitis with persistent cough and throat discomfort.  During that time she tried to eat a hamburger and felt like some of it got stuck in her throat.  She tried again a couple weeks later and a similar thing happened to a lesser degree.  That was about 5 weeks ago and she has felt well since.  Denies change in vocal quality, hemoptysis, upper abdominal pain nausea or vomiting.  Denies rectal bleeding or change in bowel habits, and says she no longer wishes to have a colonoscopy because she does not want to do a bowel preparation.  (Got lost and arrived 40 minutes late for today's visit) ROS:  Review of Systems Denies chest pain or dyspnea or dysuria  Past Medical History: Past Medical History:  Diagnosis Date   Anxiety    Arthritis    Cataract    Depression    Fibroid    Fibromyalgia    Hematoma    left buttocks due to fall/ from 2 years ago (2018)   Hypercholesterolemia    Hypertension    Neuropathy    Scleroderma ([place])    Sleep apnea      Past Surgical History: Past Surgical History:  Procedure Laterality Date    ABDOMINAL SURGERY  1997   CATARACT EXTRACTION W/PHACO Right 03/22/2020   Procedure: CATARACT EXTRACTION PHACO AND INTRAOCULAR LENS PLACEMENT ([place]);  Surgeon: Baruch Goldmann, MD;  Location: [facility];  Service: Ophthalmology;  Laterality: Right;  CDE: 5.94   CATARACT EXTRACTION W/PHACO Left 04/05/2020   Procedure: CATARACT EXTRACTION PHACO AND INTRAOCULAR LENS PLACEMENT LEFT EYE;  Surgeon: Baruch Goldmann, MD;  Location: [facility];  Service: Ophthalmology;  Laterality: Left;  CDE: 5.79   CHOLECYSTECTOMY N/A 07/20/2014   Procedure: LAPAROSCOPIC CHOLECYSTECTOMY;  Surgeon: Jamesetta So, MD;  Location: [facility];  Service: General;  Laterality: N/A;   EYE SURGERY Left    45 yrs ago- eye "went out of focus". 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: [employer]   Tobacco Use   Smoking status: Never   Smokeless tobacco: Never  Vaping Use   Vaping Use: Never used  Substance and Sexual Activity   Alcohol use: No   Drug use: No   Sexual activity: Not Currently    Partners: Male    Birth control/protection: Post-menopausal  Other Topics Concern   Not on file  Social History Narrative   Not on file   Social Determinants of Health   Financial Resource Strain: Not on file  Food Insecurity: Not on file  Transportation Needs: Not on file  Physical Activity: Not on file  Stress: Not on file  Social  Connections: Not on file    Allergies: Allergies  Allergen Reactions   Codeine Itching   Tetracyclines & Related     Tightens up stomach into a ball!    Outpatient Meds: Current Outpatient Medications  Medication Sig Dispense Refill   acetaminophen (TYLENOL) 500 MG tablet Take 1 tablet (500 mg total) by mouth every 6 (six) hours as needed. 30 tablet 0   cyclobenzaprine (FLEXERIL) 10 MG tablet Take 10 mg by mouth 3 (three) times daily as needed for muscle spasms.     DULoxetine (CYMBALTA) 60 MG capsule Take 60 mg by mouth 2 (two) times daily.      hydroxychloroquine (PLAQUENIL) 200 MG tablet Take 1 tablet (200 mg total) by mouth 2 (two) times daily. 180 tablet 1   hydrOXYzine (ATARAX/VISTARIL) 50 MG tablet Take 1 tablet (50 mg total) by mouth 3 (three) times daily as needed for itching. 30 tablet 6   lisinopril-hydrochlorothiazide (PRINZIDE,ZESTORETIC) 10-12.5 MG per tablet Take 1 tablet by mouth daily.     pantoprazole (PROTONIX) 40 MG tablet Take by mouth.     predniSONE (DELTASONE) 2.5 MG tablet Take 1 tablet (2.5 mg total) by mouth daily with breakfast. 90 tablet 1   No current facility-administered medications for this visit.      ___________________________________________________________________ Objective   Exam:  BP (!) 142/90   Pulse 85   Ht '5\' 2"'$  (1.575 m)   Wt 194 lb (88 kg)   BMI 35.48 kg/m  Wt Readings from Last 3 Encounters:  03/21/22 194 lb (88 kg)  02/06/22 193 lb (87.5 kg)  06/20/21 204 lb 9.6 oz (92.8 kg)    General: Well-appearing, normal vocal quality Eyes: sclera anicteric, no redness ENT: oral mucosa moist without lesions, no cervical or supraclavicular lymphadenopathy.  Posterior pharynx and oropharynx look normal, no thrush CV: Regular without murmur, no JVD, no peripheral edema Resp: clear to auscultation bilaterally, normal RR and effort noted GI: soft, no tenderness, with active bowel sounds. No guarding or palpable organomegaly  noted. Skin: Sclerodactyly  Labs: None  Assessment: Encounter Diagnoses  Name Primary?   Throat pain Yes   Pharyngoesophageal dysphagia     Throat symptoms after respiratory infection with 2 episodes of dysphagia.  This sounds more laryngeal than esophageal.  She was not having dysphagia prior to this respiratory infection, speaking against this as a manifestation of her systemic sclerosis  Plan:  Barium swallow If unrevealing and persistent symptoms, she should be referred to ENT.  Thank you for the courtesy of this consult.  Please call me with any questions or concerns.  Nelida Meuse III  CC: Referring provider noted above

## 2022-04-05 ENCOUNTER — Ambulatory Visit (HOSPITAL_COMMUNITY)
Admission: RE | Admit: 2022-04-05 | Discharge: 2022-04-05 | Disposition: A | Discharge status: Home / Self Care | Payer: BC Managed Care – PPO | Source: Ambulatory Visit | Attending: Gastroenterology | Admitting: Gastroenterology

## 2022-04-05 DIAGNOSIS — R070 Pain in throat: Secondary | ICD-10-CM | POA: Insufficient documentation

## 2022-04-05 DIAGNOSIS — R1314 Dysphagia, pharyngoesophageal phase: Secondary | ICD-10-CM | POA: Diagnosis present

## 2022-04-13 ENCOUNTER — Ambulatory Visit: Payer: BC Managed Care – PPO | Admitting: Nurse Practitioner

## 2022-04-19 ENCOUNTER — Ambulatory Visit
Admission: RE | Admit: 2022-04-19 | Discharge: 2022-04-19 | Disposition: A | Discharge status: Home / Self Care | Payer: BC Managed Care – PPO | Source: Ambulatory Visit | Attending: Internal Medicine | Admitting: Internal Medicine

## 2022-04-19 DIAGNOSIS — Z1231 Encounter for screening mammogram for malignant neoplasm of breast: Secondary | ICD-10-CM

## 2022-05-23 ENCOUNTER — Telehealth: Payer: Self-pay | Admitting: Internal Medicine

## 2022-05-23 NOTE — Telephone Encounter (Signed)
Patient left a voicemail stating she is in a "great deal of pain" and requested a return call.

## 2022-05-24 NOTE — Telephone Encounter (Signed)
I spoke with Kristine Young she is having a severe increase in pain at multiple areas for about the past 2 weeks.  The only preceding change she can recall was discontinuing her low-dose prednisone 3 weeks prior as directed.  I recommend we try to see her soon instead of resuming steroids without getting a better look at the symptoms.  She is okay with scheduling appointment later this week.  Can we add her onto the schedule for this Friday, August 18 at the end of the morning if possible?

## 2022-05-25 NOTE — Progress Notes (Unsigned)
Office Visit Note  Patient: Kristine Young             Date of Birth: May 04, 1961           MRN: [id number]             PCP: Monico Blitz, MD Referring: Monico Blitz, MD Visit Date: 05/26/2022   Subjective:  Follow-up (All joints in pain, no swelling. Hands and shoulders worst pain. )   History of Present Illness: Kristine Young is a 61 y.o. female here for follow up for limited systemic sclerosis with cutaneous symptoms.   ***  Left shoulder more limited Left elbow restricted extenstion B/l knees crepitus and tenderness Skin darkening on shins b/l, no pitting edema ***  Previous HPI Kristine Young is a 61 y.o. female here for follow up for limited systemic sclerosis with cutaneous symptoms on HCQ 400 mg daily and prednisone 5 mg daily attempted to taper steroid dose. Doing okay overall she still has itching on her leg and foot without new visible skin changes. She has noticed an area on the right side of her neck starting to bother her and itching. She had bronchitis recently recovered had a lot of cough and shortness of breath. Also developed throat pain and choking on some foods and liquids, worst was a piece of hamburger. Started on pantoprazole for GERD for this.    Previous HPI 06/20/21 [name] is a 61 y.o. female here for follow up for limited systemic sclerosis on HCQ 400 mg PO daily. She had taken prednisone 5 mg daily recommended to taper off this since our last follow up.  She decrease the prednisone to 2.5 mg daily without any immediate problems but in the past several weeks she has felt increased pain in multiple areas particularly in the back and bilateral hips.  She has noticed some sensation of skin tightness and itching over her distal legs.    Previous HPI  10/29/20 Kristine Young is a 61 y.o. female here for scleroderma. She takes hydroxychloroquine '400mg'$  daily and prednisone '10mg'$  daily for arthritis symptoms currently.  She was initially diagnosed by  Dr. Scarlette Shorts since about 2 years ago based on arthritis, skin changes, and high positive anti centromere Abs. Her arthritis involves multiple joints including bilateral shoulders, hands, knees, feet, and myalgias of the thighs. She lacks typical raynaud's symptoms or scleroderma related skin thickening of the hands but does have changes with skin discoloration on her arms and legs.   She sees ophthalmology in [place] and had cataract surgery about 6 months ago. She reports up to 40 lb weight gain since this problem started. She has had an echocardiogram apparently no significant problems identified. She has had PFTs consistent with mild restrictive defect.    Review of Systems  Constitutional:  Positive for fatigue.  HENT:  Positive for mouth dryness. Negative for mouth sores.   Eyes:  Positive for dryness.  Respiratory:  Negative for shortness of breath.   Cardiovascular:  Negative for chest pain and palpitations.  Gastrointestinal:  Positive for constipation and diarrhea. Negative for blood in stool.  Endocrine: Positive for increased urination.  Genitourinary:  Positive for involuntary urination.  Musculoskeletal:  Positive for joint pain, gait problem, joint pain, myalgias, muscle weakness, morning stiffness, muscle tenderness and myalgias. Negative for joint swelling.  Skin:  Positive for color change. Negative for rash, hair loss and sensitivity to sunlight.  Allergic/Immunologic: Negative for susceptible to infections.  Neurological:  Positive  for headaches. Negative for dizziness.  Hematological:  Negative for swollen glands.  Psychiatric/Behavioral:  Positive for depressed mood and sleep disturbance. The patient is nervous/anxious.     PMFS History:  Patient Active Problem List   Diagnosis Date Noted   Low back pain 06/20/2021   Proteinuria 05/03/2021   Limited systemic sclerosis ([place]) 10/29/2020   Rash and other nonspecific skin eruption 10/29/2020   Chronic arthralgias of  knees and hips 10/29/2020   Peripheral neuropathy 10/29/2020   Right upper quadrant pain 07/09/2014    Past Medical History:  Diagnosis Date   Anxiety    Arthritis    Cataract    Depression    Fibroid    Fibromyalgia    Hematoma    left buttocks due to fall/ from 2 years ago (2018)   Hypercholesterolemia    Hypertension    Neuropathy    Scleroderma ([place])    Sleep apnea     Family History  Adopted: Yes  Problem Relation Age of Onset   Colon cancer Father 46   Alzheimer's disease Father    Thyroid disease Sister    Diabetes Sister    Hypertension Sister    Liver disease Neg Hx    Pancreatic disease Neg Hx    Stomach cancer Neg Hx    Esophageal cancer Neg Hx    Past Surgical History:  Procedure Laterality Date   ABDOMINAL SURGERY  1997   CATARACT EXTRACTION W/PHACO Right 03/22/2020   Procedure: CATARACT EXTRACTION PHACO AND INTRAOCULAR LENS PLACEMENT ([place]);  Surgeon: Baruch Goldmann, MD;  Location: [facility];  Service: Ophthalmology;  Laterality: Right;  CDE: 5.94   CATARACT EXTRACTION W/PHACO Left 04/05/2020   Procedure: CATARACT EXTRACTION PHACO AND INTRAOCULAR LENS PLACEMENT LEFT EYE;  Surgeon: Baruch Goldmann, MD;  Location: [facility];  Service: Ophthalmology;  Laterality: Left;  CDE: 5.79   CHOLECYSTECTOMY N/A 07/20/2014   Procedure: LAPAROSCOPIC CHOLECYSTECTOMY;  Surgeon: Jamesetta So, MD;  Location: [facility];  Service: General;  Laterality: N/A;   EYE SURGERY Left    45 yrs ago- eye "went out of focus". x2   MYOMECTOMY     [facility]- 15 yrs ago   Social History   Social History Narrative   Not on file   Immunization History  Administered Date(s) Administered   [employer] Vaccination 12/09/2019, 01/06/2020   Tdap 10/20/2016     Objective: Vital Signs: BP 125/82 (BP Location: Right Arm, Patient Position: Sitting, Cuff Size: Large)   Pulse 84   Resp 14   Ht '5\' 2"'$  (1.575 m)   Wt 196 lb 9.6 oz (89.2 kg)   BMI 35.96 kg/m    Physical Exam    Musculoskeletal Exam: ***  CDAI Exam: CDAI Score: -- Patient Global: --; Provider Global: -- Swollen: --; Tender: -- Joint Exam 05/26/2022   No joint exam has been documented for this visit   There is currently no information documented on the homunculus. Go to the Rheumatology activity and complete the homunculus joint exam.  Investigation: No additional findings.  Imaging: No results found.  Recent Labs: Lab Results  Component Value Date   WBC 9.0 01/28/2021   HGB 13.5 01/28/2021   PLT 254 01/28/2021   NA 141 01/28/2021   K 3.9 01/28/2021   CL 102 01/28/2021   CO2 29 01/28/2021   GLUCOSE 72 01/28/2021   BUN 9 01/28/2021   CREATININE 0.79 01/28/2021   BILITOT 0.4 01/28/2021   ALKPHOS 98 07/17/2014   AST 14  01/28/2021   ALT 16 01/28/2021   PROT 6.5 01/28/2021   ALBUMIN 4.2 07/17/2014   CALCIUM 9.1 01/28/2021   GFRAA 94 01/28/2021    Speciality Comments: PLQ Eye Exam 03/17/2021 normal My Eye Dr. f/u 12 months  Procedures:  No procedures performed Allergies: Codeine and Tetracyclines & related   Assessment / Plan:     Visit Diagnoses: Limited systemic sclerosis (HCC)  High risk medication use -  HCQ 400 mg daily   Rash and other nonspecific skin eruption  ***  Orders: No orders of the defined types were placed in this encounter.  No orders of the defined types were placed in this encounter.    Follow-Up Instructions: No follow-ups on file.   Collier Salina, MD  Note - This record has been created using [employer].  Chart creation errors have been sought, but may not always  have been located. Such creation errors do not reflect on  the standard of medical care.

## 2022-05-26 ENCOUNTER — Ambulatory Visit: Payer: BC Managed Care – PPO | Attending: Internal Medicine | Admitting: Internal Medicine

## 2022-05-26 ENCOUNTER — Ambulatory Visit (INDEPENDENT_AMBULATORY_CARE_PROVIDER_SITE_OTHER): Payer: BC Managed Care – PPO

## 2022-05-26 ENCOUNTER — Encounter: Payer: Self-pay | Admitting: Internal Medicine

## 2022-05-26 VITALS — BP 125/82 | HR 84 | Resp 14 | Ht 62.0 in | Wt 196.6 lb

## 2022-05-26 DIAGNOSIS — M19012 Primary osteoarthritis, left shoulder: Secondary | ICD-10-CM

## 2022-05-26 DIAGNOSIS — M19011 Primary osteoarthritis, right shoulder: Secondary | ICD-10-CM

## 2022-05-26 DIAGNOSIS — Z79899 Other long term (current) drug therapy: Secondary | ICD-10-CM

## 2022-05-26 DIAGNOSIS — R21 Rash and other nonspecific skin eruption: Secondary | ICD-10-CM

## 2022-05-26 DIAGNOSIS — M349 Systemic sclerosis, unspecified: Secondary | ICD-10-CM | POA: Diagnosis not present

## 2022-05-26 DIAGNOSIS — M1990 Unspecified osteoarthritis, unspecified site: Secondary | ICD-10-CM

## 2022-05-26 MED ORDER — PREDNISONE 5 MG PO TABS: 5.0000 mg | ORAL_TABLET | Freq: Every day | ORAL | 0 refills | Status: DC

## 2022-05-26 NOTE — Patient Instructions (Addendum)
I am sending a new prednisone prescription, I recommend resuming with the 5 mg daily for current symptoms worsening, if doing well could go back to 2.5 mg daily previous dose. We will be calling about adding on longer term medication option. Usually we will need to follow up within about 1-2 months to reassess with a new medication start.

## 2022-05-29 LAB — COMPLETE METABOLIC PANEL WITH GFR
AG Ratio: 1.7 (calc) (ref 1.0–2.5)
ALT: 18 U/L (ref 6–29)
AST: 19 U/L (ref 10–35)
Albumin: 4.4 g/dL (ref 3.6–5.1)
Alkaline phosphatase (APISO): 68 U/L (ref 37–153)
BUN: 10 mg/dL (ref 7–25)
CO2: 29 mmol/L (ref 20–32)
Calcium: 9.6 mg/dL (ref 8.6–10.4)
Chloride: 103 mmol/L (ref 98–110)
Creat: 0.82 mg/dL (ref 0.50–1.05)
Globulin: 2.6 g/dL (calc) (ref 1.9–3.7)
Glucose, Bld: 92 mg/dL (ref 65–99)
Potassium: 4.3 mmol/L (ref 3.5–5.3)
Sodium: 141 mmol/L (ref 135–146)
Total Bilirubin: 0.4 mg/dL (ref 0.2–1.2)
Total Protein: 7 g/dL (ref 6.1–8.1)
eGFR: 81 mL/min/{1.73_m2} (ref >=60)

## 2022-05-29 LAB — HEPATITIS B SURFACE ANTIGEN: Hepatitis B Surface Ag: NONREACTIVE

## 2022-05-29 LAB — CBC WITH DIFFERENTIAL/PLATELET
Absolute Monocytes: 529 cells/uL (ref 200–950)
Basophils Absolute: 38 cells/uL (ref 0–200)
Basophils Relative: 0.6 %
Eosinophils Absolute: 183 cells/uL (ref 15–500)
Eosinophils Relative: 2.9 %
HCT: 40.1 % (ref 35.0–45.0)
Hemoglobin: 13.5 g/dL (ref 11.7–15.5)
Lymphs Abs: 2129 cells/uL (ref 850–3900)
MCH: 32 pg (ref 27.0–33.0)
MCHC: 33.7 g/dL (ref 32.0–36.0)
MCV: 95 fL (ref 80.0–100.0)
MPV: 10.7 fL (ref 7.5–12.5)
Monocytes Relative: 8.4 %
Neutro Abs: 3421 cells/uL (ref 1500–7800)
Neutrophils Relative %: 54.3 %
Platelets: 255 10*3/uL (ref 140–400)
RBC: 4.22 10*6/uL (ref 3.80–5.10)
RDW: 12 % (ref 11.0–15.0)
Total Lymphocyte: 33.8 %
WBC: 6.3 10*3/uL (ref 3.8–10.8)

## 2022-05-29 LAB — C-REACTIVE PROTEIN: CRP: 1.7 mg/L (ref <8.0)

## 2022-05-29 LAB — SEDIMENTATION RATE: Sed Rate: 9 mm/h (ref 0–30)

## 2022-05-29 LAB — HEPATITIS B CORE ANTIBODY, IGM: Hep B C IgM: NONREACTIVE

## 2022-06-13 ENCOUNTER — Telehealth: Payer: Self-pay | Admitting: Internal Medicine

## 2022-06-13 NOTE — Telephone Encounter (Signed)
Patient called requesting a return call with the results of her labwork.

## 2022-06-15 NOTE — Telephone Encounter (Signed)
I recommend Kristine Young can reschedule her planned follow-up to December or January as we are not switching her to methotrexate at this time can continue the hydroxychloroquine and low-dose prednisone does not require close lab monitoring before that time.  FYI- I spoke with Kristine Young reviewed that her recent labs were normal and left shoulder x-ray did not show significant osteoarthritis.  We discussed potentially switching hydroxychloroquine to methotrexate she would like to hold off at this time since she is about to experience some upcoming changes with her work situation.  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

## 2022-06-27 NOTE — Progress Notes (Deleted)
Office Visit Note  Patient: Kristine Young             Date of Birth: [birth year]           MRN: [id number]             PCP: Monico Blitz, MD Referring: Monico Blitz, MD Visit Date: 07/07/2022   Subjective:  No chief complaint on file.   History of Present Illness: Kristine Young is a 61 y.o. female here for follow up for limited systemic sclerosis with cutaneous symptoms   Previous HPI 05/26/2022 Kristine Young is a 61 y.o. female here for follow up for limited systemic sclerosis with cutaneous symptoms.  Since her last visit she tapered off the remaining prednisone stopping the 2.5 mg daily dose for about a month now.  Symptoms were doing pretty well but 2 to 3 weeks after ending the steroids she started developing diffuse joint pains and stiffness that have now been severe for 1 or 2 weeks.  Her hands and shoulders are the most severely affected and worst in her left arm.  She has not seen any visible swelling or changes in the affected areas.  No new skin changes observed.     Previous HPI Kristine Young is a 61 y.o. female here for follow up for limited systemic sclerosis with cutaneous symptoms on HCQ 400 mg daily and prednisone 5 mg daily attempted to taper steroid dose. Doing okay overall she still has itching on her leg and foot without new visible skin changes. She has noticed an area on the right side of her neck starting to bother her and itching. She had bronchitis recently recovered had a lot of cough and shortness of breath. Also developed throat pain and choking on some foods and liquids, worst was a piece of hamburger. Started on pantoprazole for GERD for this.    Previous HPI 06/20/21 Kristine Young is a 61 y.o. female here for follow up for limited systemic sclerosis on HCQ 400 mg PO daily. She had taken prednisone 5 mg daily recommended to taper off this since our last follow up.  She decrease the prednisone to 2.5 mg daily without any immediate problems but in the  past several weeks she has felt increased pain in multiple areas particularly in the back and bilateral hips.  She has noticed some sensation of skin tightness and itching over her distal legs.    Previous HPI  10/29/20 Kristine Young is a 61 y.o. female here for scleroderma. She takes hydroxychloroquine '400mg'$  daily and prednisone '10mg'$  daily for arthritis symptoms currently.  She was initially diagnosed by Dr. Scarlette Shorts since about 2 years ago based on arthritis, skin changes, and high positive anti centromere Abs. Her arthritis involves multiple joints including bilateral shoulders, hands, knees, feet, and myalgias of the thighs. She lacks typical raynaud's symptoms or scleroderma related skin thickening of the hands but does have changes with skin discoloration on her arms and legs.   She sees ophthalmology in [place] and had cataract surgery about 6 months ago. She reports up to 40 lb weight gain since this problem started. She has had an echocardiogram apparently no significant problems identified. She has had PFTs consistent with mild restrictive defect.     No Rheumatology ROS completed.   PMFS History:  Patient Active Problem List   Diagnosis Date Noted   Low back pain 06/20/2021   Proteinuria 05/03/2021   Limited systemic sclerosis ([place]) 10/29/2020  Rash and other nonspecific skin eruption 10/29/2020   Chronic arthralgias of knees and hips 10/29/2020   Peripheral neuropathy 10/29/2020   Right upper quadrant pain 07/09/2014    Past Medical History:  Diagnosis Date   Anxiety    Arthritis    Cataract    Depression    Fibroid    Fibromyalgia    Hematoma    left buttocks due to fall/ from 2 years ago (2018)   Hypercholesterolemia    Hypertension    Neuropathy    Scleroderma ([place])    Sleep apnea     Family History  Adopted: Yes  Problem Relation Age of Onset   Colon cancer Father 53   Alzheimer's disease Father    Thyroid disease Sister    Diabetes Sister     Hypertension Sister    Liver disease Neg Hx    Pancreatic disease Neg Hx    Stomach cancer Neg Hx    Esophageal cancer Neg Hx    Past Surgical History:  Procedure Laterality Date   ABDOMINAL SURGERY  1997   CATARACT EXTRACTION W/PHACO Right 03/22/2020   Procedure: CATARACT EXTRACTION PHACO AND INTRAOCULAR LENS PLACEMENT ([place]);  Surgeon: Baruch Goldmann, MD;  Location: [facility];  Service: Ophthalmology;  Laterality: Right;  CDE: 5.94   CATARACT EXTRACTION W/PHACO Left 04/05/2020   Procedure: CATARACT EXTRACTION PHACO AND INTRAOCULAR LENS PLACEMENT LEFT EYE;  Surgeon: Baruch Goldmann, MD;  Location: [facility];  Service: Ophthalmology;  Laterality: Left;  CDE: 5.79   CHOLECYSTECTOMY N/A 07/20/2014   Procedure: LAPAROSCOPIC CHOLECYSTECTOMY;  Surgeon: Jamesetta So, MD;  Location: [facility];  Service: General;  Laterality: N/A;   EYE SURGERY Left    45 yrs ago- eye "went out of focus". x2   MYOMECTOMY     [facility]- 15 yrs ago   Social History   Social History Narrative   Not on file   Immunization History  Administered Date(s) Administered   [employer] Vaccination 12/09/2019, 01/06/2020   Tdap 10/20/2016     Objective: Vital Signs: There were no vitals taken for this visit.   Physical Exam   Musculoskeletal Exam: ***  CDAI Exam: CDAI Score: -- Patient Global: --; Provider Global: -- Swollen: --; Tender: -- Joint Exam 07/07/2022   No joint exam has been documented for this visit   There is currently no information documented on the homunculus. Go to the Rheumatology activity and complete the homunculus joint exam.  Investigation: No additional findings.  Imaging: No results found.  Recent Labs: Lab Results  Component Value Date   WBC 6.3 05/26/2022   HGB 13.5 05/26/2022   PLT 255 05/26/2022   NA 141 05/26/2022   K 4.3 05/26/2022   CL 103 05/26/2022   CO2 29 05/26/2022   GLUCOSE 92 05/26/2022   BUN 10 05/26/2022   CREATININE 0.82 05/26/2022   BILITOT 0.4  05/26/2022   ALKPHOS 98 07/17/2014   AST 19 05/26/2022   ALT 18 05/26/2022   PROT 7.0 05/26/2022   ALBUMIN 4.2 07/17/2014   CALCIUM 9.6 05/26/2022   GFRAA 94 01/28/2021    Speciality Comments: PLQ Eye Exam 03/17/2021 normal My Eye Dr. f/u 12 months  Procedures:  No procedures performed Allergies: Codeine and Tetracyclines & related   Assessment / Plan:     Visit Diagnoses: No diagnosis found.  ***  Orders: No orders of the defined types were placed in this encounter.  No orders of the defined types were placed in this encounter.  Instructions: No follow-ups on file.   Alazae Crymes L Janeya Deyo, RT  Note - This record has been created using Dragon software.  Chart creation errors have been sought, but may not always  have been located. Such creation errors do not reflect on  the standard of medical care.  

## 2022-07-07 ENCOUNTER — Ambulatory Visit: Payer: BC Managed Care – PPO | Admitting: Internal Medicine

## 2022-07-07 DIAGNOSIS — M349 Systemic sclerosis, unspecified: Secondary | ICD-10-CM

## 2022-07-07 DIAGNOSIS — Z79899 Other long term (current) drug therapy: Secondary | ICD-10-CM

## 2022-07-07 DIAGNOSIS — M1990 Unspecified osteoarthritis, unspecified site: Secondary | ICD-10-CM

## 2022-07-07 DIAGNOSIS — R21 Rash and other nonspecific skin eruption: Secondary | ICD-10-CM

## 2022-08-09 ENCOUNTER — Ambulatory Visit: Payer: BC Managed Care – PPO | Admitting: Internal Medicine

## 2022-10-22 NOTE — Progress Notes (Unsigned)
Office Visit Note  Patient: Kristine Young             Date of Birth: 12-24-1960           MRN: [id number]             PCP: Monico Blitz, MD Referring: Monico Blitz, MD Visit Date: 10/23/2022   Subjective:  No chief complaint on file.   History of Present Illness: Kristine Young is a 62 y.o. female here for follow up ***   Previous HPI 05/26/22 Kristine Young is a 62 y.o. female here for follow up for limited systemic sclerosis with cutaneous symptoms.  Since her last visit she tapered off the remaining prednisone stopping the 2.5 mg daily dose for about a month now.  Symptoms were doing pretty well but 2 to 3 weeks after ending the steroids she started developing diffuse joint pains and stiffness that have now been severe for 1 or 2 weeks.  Her hands and shoulders are the most severely affected and worst in her left arm.  She has not seen any visible swelling or changes in the affected areas.  No new skin changes observed.     Previous HPI Kristine Young is a 62 y.o. female here for follow up for limited systemic sclerosis with cutaneous symptoms on HCQ 400 mg daily and prednisone 5 mg daily attempted to taper steroid dose. Doing okay overall she still has itching on her leg and foot without new visible skin changes. She has noticed an area on the right side of her neck starting to bother her and itching. She had bronchitis recently recovered had a lot of cough and shortness of breath. Also developed throat pain and choking on some foods and liquids, worst was a piece of hamburger. Started on pantoprazole for GERD for this.    Previous HPI 06/20/21 Kristine Young is a 62 y.o. female here for follow up for limited systemic sclerosis on HCQ 400 mg PO daily. She had taken prednisone 5 mg daily recommended to taper off this since our last follow up.  She decrease the prednisone to 2.5 mg daily without any immediate problems but in the past several weeks she has felt increased pain in  multiple areas particularly in the back and bilateral hips.  She has noticed some sensation of skin tightness and itching over her distal legs.    Previous HPI  10/29/20 Kristine Young is a 62 y.o. female here for scleroderma. She takes hydroxychloroquine '400mg'$  daily and prednisone '10mg'$  daily for arthritis symptoms currently.  She was initially diagnosed by Dr. Scarlette Shorts since about 2 years ago based on arthritis, skin changes, and high positive anti centromere Abs. Her arthritis involves multiple joints including bilateral shoulders, hands, knees, feet, and myalgias of the thighs. She lacks typical raynaud's symptoms or scleroderma related skin thickening of the hands but does have changes with skin discoloration on her arms and legs.   She sees ophthalmology in [place] and had cataract surgery about 6 months ago. She reports up to 40 lb weight gain since this problem started. She has had an echocardiogram apparently no significant problems identified. She has had PFTs consistent with mild restrictive defect.   No Rheumatology ROS completed.   PMFS History:  Patient Active Problem List   Diagnosis Date Noted   Low back pain 06/20/2021   Proteinuria 05/03/2021   Limited systemic sclerosis ([place]) 10/29/2020   Rash and other nonspecific skin eruption 10/29/2020  Chronic arthralgias of knees and hips 10/29/2020   Peripheral neuropathy 10/29/2020   Right upper quadrant pain 07/09/2014    Past Medical History:  Diagnosis Date   Anxiety    Arthritis    Cataract    Depression    Fibroid    Fibromyalgia    Hematoma    left buttocks due to fall/ from 2 years ago (2018)   Hypercholesterolemia    Hypertension    Neuropathy    Scleroderma ([place])    Sleep apnea     Family History  Adopted: Yes  Problem Relation Age of Onset   Colon cancer Father 88   Alzheimer's disease Father    Thyroid disease Sister    Diabetes Sister    Hypertension Sister    Liver disease Neg Hx    Pancreatic  disease Neg Hx    Stomach cancer Neg Hx    Esophageal cancer Neg Hx    Past Surgical History:  Procedure Laterality Date   ABDOMINAL SURGERY  1997   CATARACT EXTRACTION W/PHACO Right 03/22/2020   Procedure: CATARACT EXTRACTION PHACO AND INTRAOCULAR LENS PLACEMENT ([place]);  Surgeon: Baruch Goldmann, MD;  Location: [facility];  Service: Ophthalmology;  Laterality: Right;  CDE: 5.94   CATARACT EXTRACTION W/PHACO Left 04/05/2020   Procedure: CATARACT EXTRACTION PHACO AND INTRAOCULAR LENS PLACEMENT LEFT EYE;  Surgeon: Baruch Goldmann, MD;  Location: [facility];  Service: Ophthalmology;  Laterality: Left;  CDE: 5.79   CHOLECYSTECTOMY N/A 07/20/2014   Procedure: LAPAROSCOPIC CHOLECYSTECTOMY;  Surgeon: Jamesetta So, MD;  Location: [facility];  Service: General;  Laterality: N/A;   EYE SURGERY Left    45 yrs ago- eye "went out of focus". x2   MYOMECTOMY     [facility]- 15 yrs ago   Social History   Social History Narrative   Not on file   Immunization History  Administered Date(s) Administered   [employer] Vaccination 12/09/2019, 01/06/2020   Tdap 10/20/2016     Objective: Vital Signs: There were no vitals taken for this visit.   Physical Exam   Musculoskeletal Exam: ***  CDAI Exam: CDAI Score: -- Patient Global: --; Provider Global: -- Swollen: --; Tender: -- Joint Exam 10/23/2022   No joint exam has been documented for this visit   There is currently no information documented on the homunculus. Go to the Rheumatology activity and complete the homunculus joint exam.  Investigation: No additional findings.  Imaging: No results found.  Recent Labs: Lab Results  Component Value Date   WBC 6.3 05/26/2022   HGB 13.5 05/26/2022   PLT 255 05/26/2022   NA 141 05/26/2022   K 4.3 05/26/2022   CL 103 05/26/2022   CO2 29 05/26/2022   GLUCOSE 92 05/26/2022   BUN 10 05/26/2022   CREATININE 0.82 05/26/2022   BILITOT 0.4 05/26/2022   ALKPHOS 98 07/17/2014   AST 19 05/26/2022   ALT  18 05/26/2022   PROT 7.0 05/26/2022   ALBUMIN 4.2 07/17/2014   CALCIUM 9.6 05/26/2022   GFRAA 94 01/28/2021    Speciality Comments: PLQ Eye Exam 03/17/2021 normal My Eye Dr. f/u 12 months  Procedures:  No procedures performed Allergies: Codeine and Tetracyclines & related   Assessment / Plan:     Visit Diagnoses: No diagnosis found.  ***  Orders: No orders of the defined types were placed in this encounter.  No orders of the defined types were placed in this encounter.    Follow-Up Instructions: No follow-ups on file.  Collier Salina, MD  Note - This record has been created using [employer].  Chart creation errors have been sought, but may not always  have been located. Such creation errors do not reflect on  the standard of medical care.

## 2022-10-23 ENCOUNTER — Ambulatory Visit: Payer: BC Managed Care – PPO | Attending: Internal Medicine | Admitting: Internal Medicine

## 2022-10-23 ENCOUNTER — Encounter: Payer: Self-pay | Admitting: Internal Medicine

## 2022-10-23 VITALS — BP 122/78 | HR 72 | Resp 15 | Ht 62.5 in | Wt 199.0 lb

## 2022-10-23 DIAGNOSIS — K219 Gastro-esophageal reflux disease without esophagitis: Secondary | ICD-10-CM

## 2022-10-23 DIAGNOSIS — G8929 Other chronic pain: Secondary | ICD-10-CM

## 2022-10-23 DIAGNOSIS — M349 Systemic sclerosis, unspecified: Secondary | ICD-10-CM | POA: Diagnosis not present

## 2022-10-23 DIAGNOSIS — M25561 Pain in right knee: Secondary | ICD-10-CM

## 2022-10-23 DIAGNOSIS — R21 Rash and other nonspecific skin eruption: Secondary | ICD-10-CM

## 2022-10-23 DIAGNOSIS — M25551 Pain in right hip: Secondary | ICD-10-CM | POA: Diagnosis not present

## 2022-10-23 DIAGNOSIS — M25562 Pain in left knee: Secondary | ICD-10-CM

## 2022-10-23 DIAGNOSIS — M170 Bilateral primary osteoarthritis of knee: Secondary | ICD-10-CM

## 2022-10-23 DIAGNOSIS — M25552 Pain in left hip: Secondary | ICD-10-CM

## 2022-10-23 MED ORDER — HYDROXYCHLOROQUINE SULFATE 200 MG PO TABS: 200.0000 mg | ORAL_TABLET | Freq: Two times a day (BID) | ORAL | 1 refills | Status: DC

## 2022-10-23 MED ORDER — PANTOPRAZOLE SODIUM 20 MG PO TBEC: 20.0000 mg | DELAYED_RELEASE_TABLET | Freq: Every day | ORAL | 1 refills | Status: AC

## 2022-10-23 MED ORDER — PREDNISONE 2.5 MG PO TABS: 2.5000 mg | ORAL_TABLET | Freq: Every morning | ORAL | 1 refills | Status: DC

## 2022-10-24 LAB — CBC WITH DIFFERENTIAL/PLATELET
Absolute Monocytes: 813 cells/uL (ref 200–950)
Basophils Absolute: 58 cells/uL (ref 0–200)
Basophils Relative: 0.7 %
Eosinophils Absolute: 125 cells/uL (ref 15–500)
Eosinophils Relative: 1.5 %
HCT: 39.2 % (ref 35.0–45.0)
Hemoglobin: 13.4 g/dL (ref 11.7–15.5)
Lymphs Abs: 2498 cells/uL (ref 850–3900)
MCH: 32.8 pg (ref 27.0–33.0)
MCHC: 34.2 g/dL (ref 32.0–36.0)
MCV: 96.1 fL (ref 80.0–100.0)
MPV: 11.1 fL (ref 7.5–12.5)
Monocytes Relative: 9.8 %
Neutro Abs: 4806 cells/uL (ref 1500–7800)
Neutrophils Relative %: 57.9 %
Platelets: 268 10*3/uL (ref 140–400)
RBC: 4.08 10*6/uL (ref 3.80–5.10)
RDW: 12.1 % (ref 11.0–15.0)
Total Lymphocyte: 30.1 %
WBC: 8.3 10*3/uL (ref 3.8–10.8)

## 2022-10-24 LAB — COMPLETE METABOLIC PANEL WITH GFR
AG Ratio: 1.7 (calc) (ref 1.0–2.5)
ALT: 22 U/L (ref 6–29)
AST: 18 U/L (ref 10–35)
Albumin: 4.5 g/dL (ref 3.6–5.1)
Alkaline phosphatase (APISO): 74 U/L (ref 37–153)
BUN: 13 mg/dL (ref 7–25)
CO2: 29 mmol/L (ref 20–32)
Calcium: 10.2 mg/dL (ref 8.6–10.4)
Chloride: 104 mmol/L (ref 98–110)
Creat: 0.68 mg/dL (ref 0.50–1.05)
Globulin: 2.6 g/dL (calc) (ref 1.9–3.7)
Glucose, Bld: 76 mg/dL (ref 65–99)
Potassium: 4.6 mmol/L (ref 3.5–5.3)
Sodium: 142 mmol/L (ref 135–146)
Total Bilirubin: 0.5 mg/dL (ref 0.2–1.2)
Total Protein: 7.1 g/dL (ref 6.1–8.1)
eGFR: 98 mL/min/{1.73_m2} (ref >=60)

## 2022-10-24 LAB — SEDIMENTATION RATE: Sed Rate: 9 mm/h (ref 0–30)

## 2022-10-30 NOTE — Progress Notes (Signed)
Lab results look good for continuing the hydroxychloroquine and low dose prednisone.

## 2023-02-24 IMAGING — MG MM DIGITAL SCREENING BILAT W/ TOMO AND CAD
8 series · 8 of 24 positions shown · non-contrast
Comparison: Previous exam(s).

ACR Breast Density Category a: The breast tissue is almost entirely
fatty.

CLINICAL DATA: Screening.

EXAM:
DIGITAL SCREENING BILATERAL MAMMOGRAM WITH TOMOSYNTHESIS AND CAD
TECHNIQUE: Bilateral screening digital craniocaudal and mediolateral oblique
mammograms were obtained. Bilateral screening digital breast
tomosynthesis was performed. The images were evaluated with
computer-aided detection.

[L MLO synth-2D]
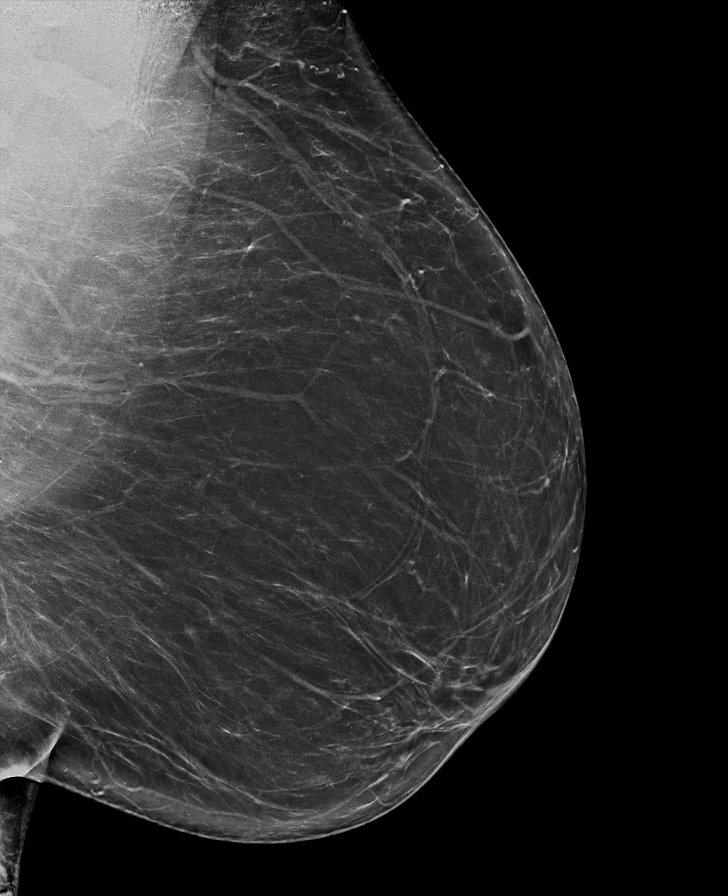

[R MLO synth-2D]
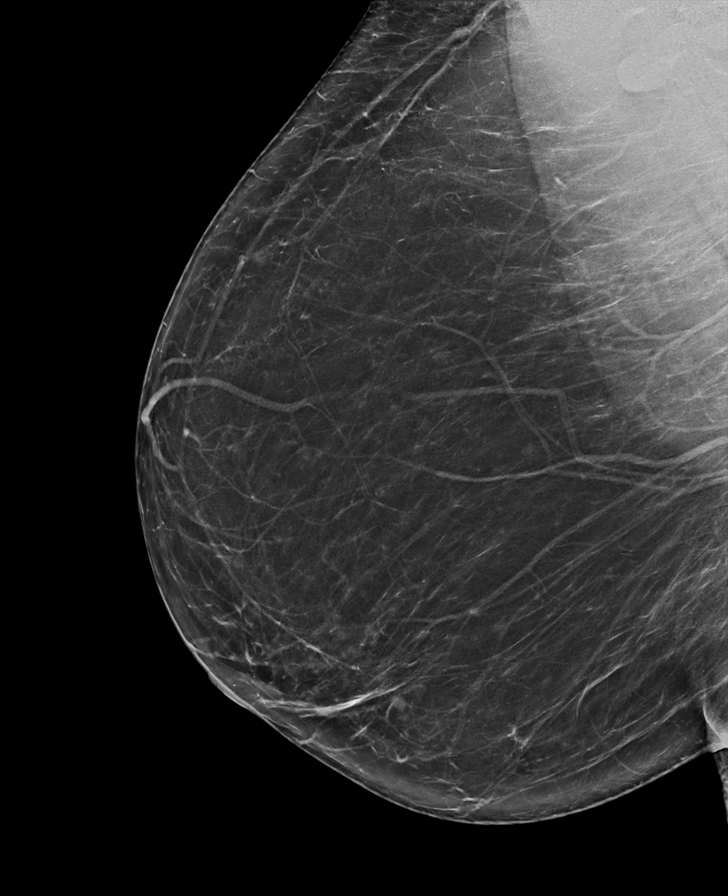

[L CC synth-2D]
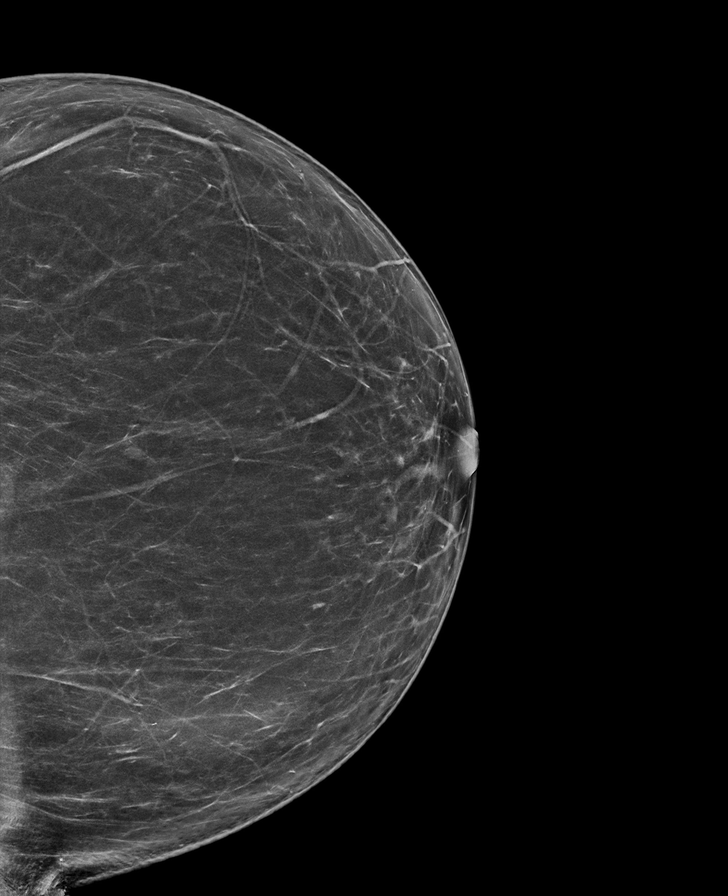

[R CC synth-2D]
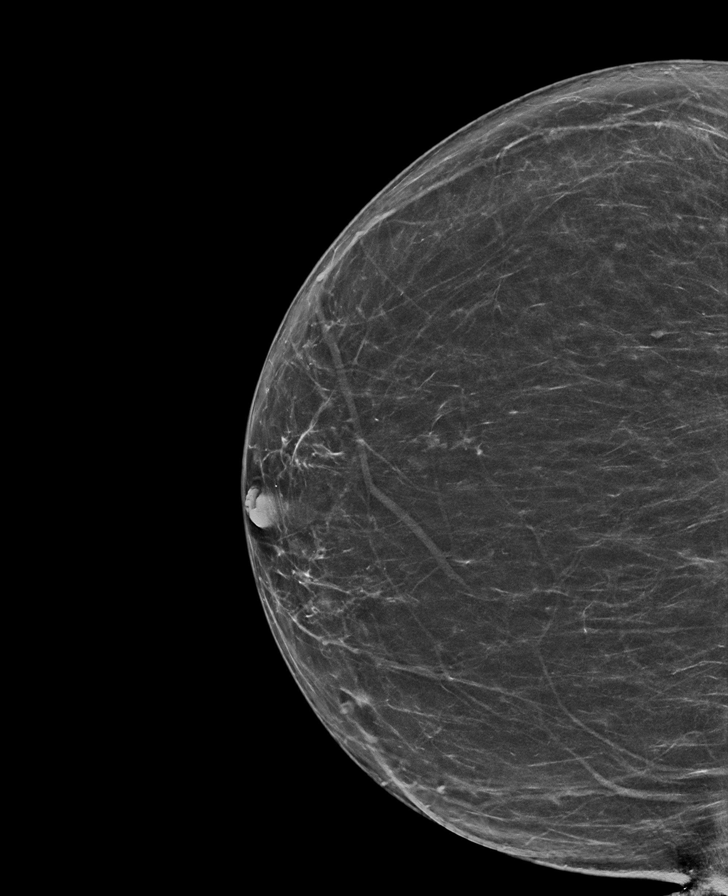

[R CC tomo · tomo slice 39/76.0]
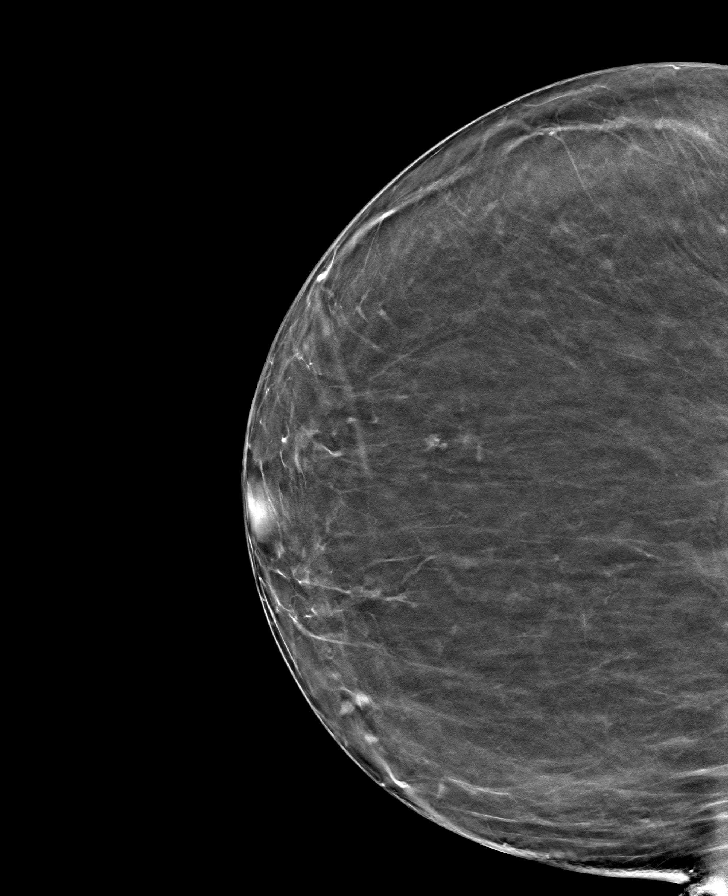

[L MLO tomo · tomo slice 45/88.0]
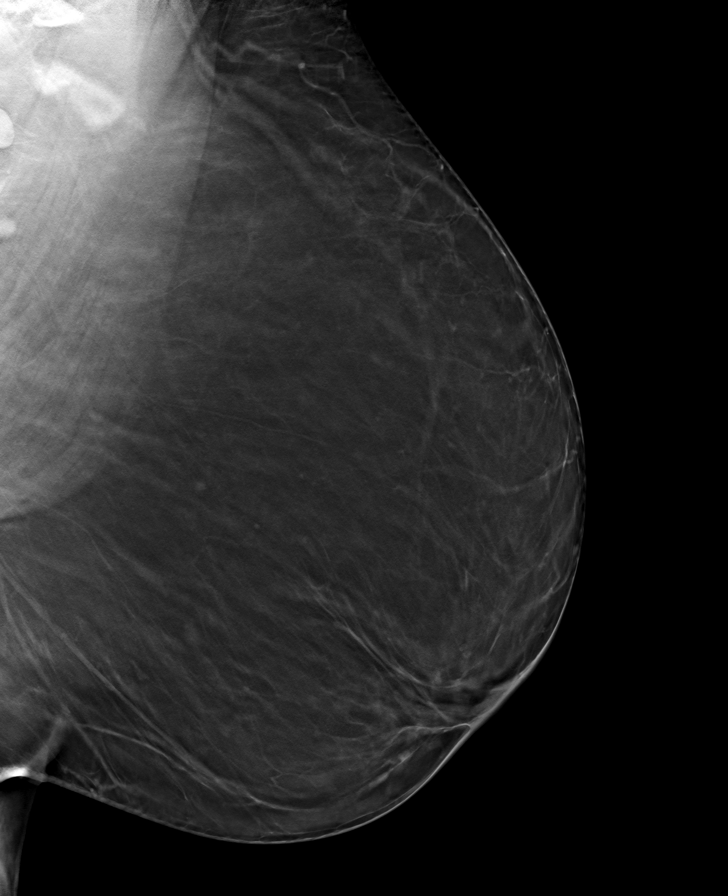

[L CC tomo · tomo slice 40/79.0]
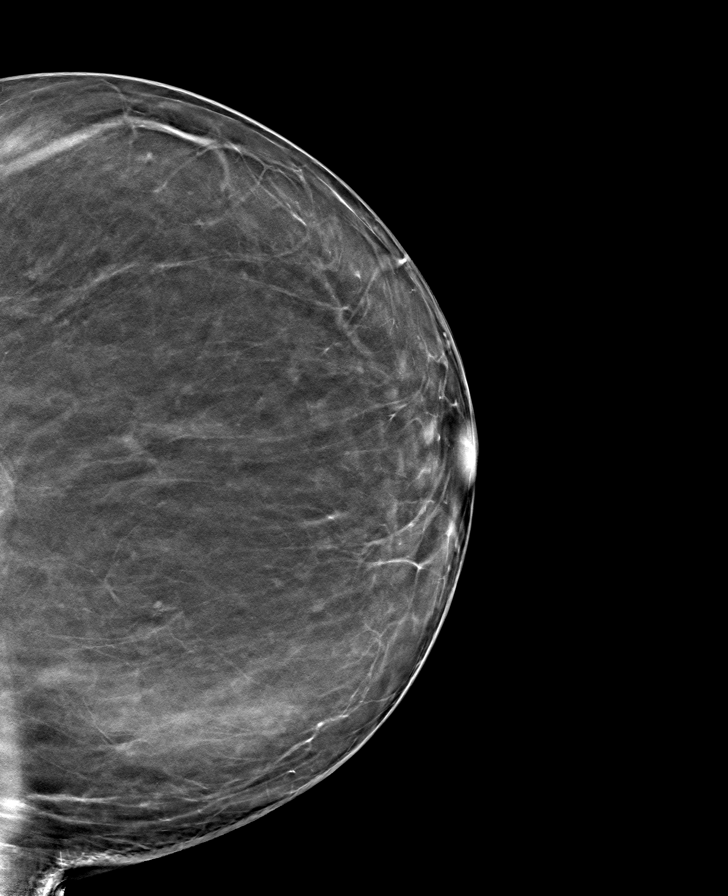

[R MLO tomo · tomo slice 41/82.0]
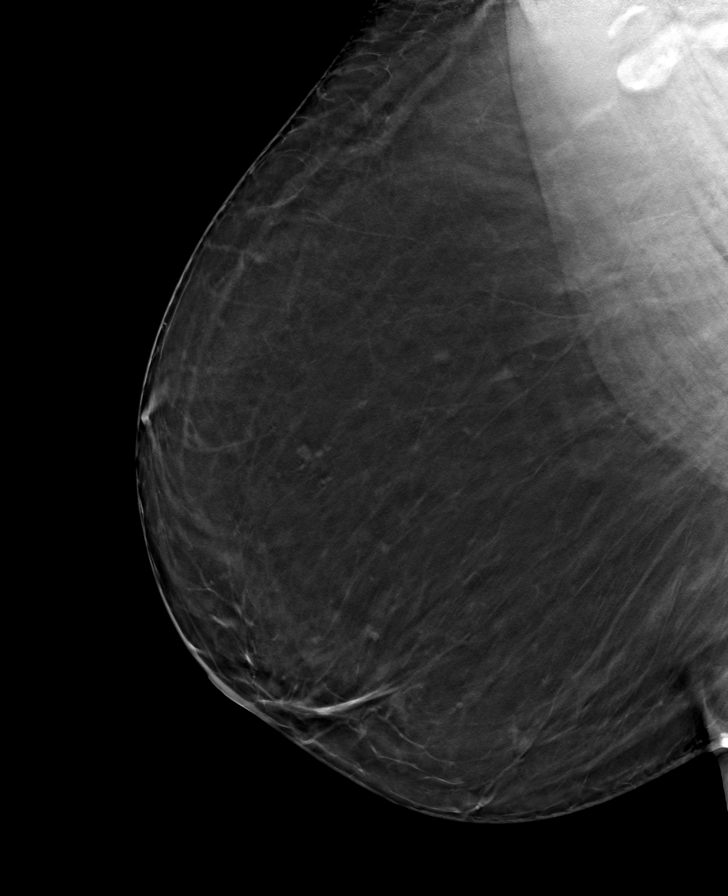

[8 of 24 positions shown; findings below may reference images not displayed]

FINDINGS: There are no findings suspicious for malignancy. The images were
evaluated with computer-aided detection.
IMPRESSION: No mammographic evidence of malignancy. A result letter of this
screening mammogram will be mailed directly to the patient.

RECOMMENDATION:
Screening mammogram in one year. (Code:JP-J-DD5)

BI-RADS CATEGORY  1: Negative.

## 2023-04-06 DIAGNOSIS — N281 Cyst of kidney, acquired: Secondary | ICD-10-CM | POA: Insufficient documentation

## 2023-04-06 NOTE — Progress Notes (Unsigned)
Kristine Young [birth year] [id number]  History of Present Illness: Ms. Helinski is a 62 y.o. female who presents today as a new patient at [facility] [place]. All available relevant medical records have been reviewed.  - GU / ***GYN History: 1. ***.  ***no outside relevant records  She reports chief complaint of ***frequent UTls.   Recent imaging: - 02/16/2023: Per CT abdomen/pelvis w/o contrast report: "Hypodense foci both kidneys. Although these could represent parapelvic cysts, other etiology cannot be excluded. Renal CT without and with IV contrast be helpful in further evaluation." - 03/02/2023: CT abdomen/pelvis w/wo contrast showed "Multiple benign bilateral parapelvic cysts." No renal calculi or hydronephrosis.   Urine culture results in past 12 months: No ***positive/ ***additional urine culture results in past 12 months found per chart review.  Urinary Symptoms: She reports *** UTl's in the last year. When present, UTI symptoms include ***dysuria, ***increased urinary urgency, ***frequency, ***. She reports that UTI symptoms do ***not seem to correlate with intercourse. She {Actions; denies-reports:120008} acute UTI symptoms today.  At baseline: She {Actions; denies-reports:120008} urinary urgency, frequency, dysuria, gross hematuria, hesitancy, straining to void, or sensations of incomplete emptying. She reports voiding *** times per day and *** times at night. She {Actions; denies-reports:120008} pushing on a bulge in order to empty her bladder.  She {Actions; denies-reports:120008} urge incontinence. She {Actions; denies-reports:120008} stress incontinence with ***cough/***laugh/***sneeze/***heavy lifting/***exercise. She {Actions; denies-reports:120008} enuresis. She leaks *** times per ***. Wears *** ***pads / ***diapers per day. She states the ***SUI / ***UUI is predominant. This has been going on for {NUMBERS 1-12:18279} {days/wks/mos/yrs:310907} and  {ACTION; IS/IS NWG:95621308} significantly bothersome. In terms of treatment, She has tried ***.  She {Actions; denies-reports:120008} caffeine intake.  She {Actions; denies-reports:120008} history of pyelonephritis.  She {Actions; denies-reports:120008} history of kidney stones.  Vaginal / prolapse Symptoms: She {Actions; denies-reports:120008} vaginal bulge sensation.  She {Actions; denies-reports:120008} seeing a vaginal bulge. This bulge is bothersome and is the size of ***. It was first noticed ***.  She {Actions; denies-reports:120008} vaginal pain, bleeding, or discharge.  She {Actions; denies-reports:120008} use of topical vaginal estrogen cream.  Bowel Symptoms: She has a continent bowel movement *** time(s) per ***. Typical Bristol stool scale of continent bowel episodes is Type ***. She {Actions; denies-reports:120008} Fl episodes. Fecal incontinence started *** and occurs *** times per week. Typical Bristol stool scale of incontinent bowel episodes is Type ***. She {Actions; denies-reports:120008} straining and {Actions; denies-reports:120008} splinting to defecate. She {Actions; denies-reports:120008} rectal bleeding. She {Actions; denies-reports:120008} feeling like she fully empties her rectum with defecation. She {Actions; denies-reports:120008} urgency with defecation. She {Actions; denies-reports:120008} difficulty wiping clean. She {Actions; denies-reports:120008} taking laxatives, stool softeners, or fiber supplements. Last colonoscopy was***.  Past OB/GYN History: OB History     Gravida  0   Para  0   Term  0   Preterm  0   AB  0   Living  0      SAB  0   IAB  0   Ectopic  0   Multiple  0   Live Births  0          She {Actions; denies-reports:120008} being sexually active.  She {Actions; denies-reports:120008} dyspareunia. ***Dyspareunia is located ***at the introitus / ***deep inside the vagina. She has *** child(ren) who were delivered  ***vaginally ***via c-section. She {Actions; denies-reports:120008} significant tearing with that ***delivery / those ***deliveries. She is {DESC; PRE/POST:32304} menopausal.  Last pap smear was ***. She {Actions; denies-reports:120008} history of ***  hysterectomy.  Fall Screening: Do you usually have a device to assist in your mobility? {yes/no:20286} ***cane / ***walker / ***wheelchair   Medications: Current Outpatient Medications  Medication Sig Dispense Refill   acetaminophen (TYLENOL) 500 MG tablet Take 1 tablet (500 mg total) by mouth every 6 (six) hours as needed. 30 tablet 0   cyclobenzaprine (FLEXERIL) 10 MG tablet Take 10 mg by mouth 3 (three) times daily as needed for muscle spasms.     DULoxetine (CYMBALTA) 60 MG capsule Take 60 mg by mouth 2 (two) times daily.      hydroxychloroquine (PLAQUENIL) 200 MG tablet Take 1 tablet (200 mg total) by mouth 2 (two) times daily. 180 tablet 1   hydrOXYzine (ATARAX/VISTARIL) 50 MG tablet Take 1 tablet (50 mg total) by mouth 3 (three) times daily as needed for itching. (Patient not taking: Reported on 10/23/2022) 30 tablet 6   lisinopril-hydrochlorothiazide (PRINZIDE,ZESTORETIC) 10-12.5 MG per tablet Take 1 tablet by mouth daily.     pantoprazole (PROTONIX) 20 MG tablet Take 1 tablet (20 mg total) by mouth daily. 90 tablet 1   predniSONE (DELTASONE) 2.5 MG tablet Take 1 tablet (2.5 mg total) by mouth every morning. 90 tablet 1   No current facility-administered medications for this visit.    Allergies: Allergies  Allergen Reactions   Codeine Itching   Tetracyclines & Related     Tightens up stomach into a ball!    Past Medical History:  Diagnosis Date   Anxiety    Arthritis    Cataract    Depression    Fibroid    Fibromyalgia    Hematoma    left buttocks due to fall/ from 2 years ago (2018)   Hypercholesterolemia    Hypertension    Neuropathy    Scleroderma (HCC)    Sleep apnea    Past Surgical History:  Procedure  Laterality Date   ABDOMINAL SURGERY  1997   CATARACT EXTRACTION W/PHACO Right 03/22/2020   Procedure: CATARACT EXTRACTION PHACO AND INTRAOCULAR LENS PLACEMENT (IOC);  Surgeon: Fabio Pierce, MD;  Location: [facility];  Service: Ophthalmology;  Laterality: Right;  CDE: 5.94   CATARACT EXTRACTION W/PHACO Left 04/05/2020   Procedure: CATARACT EXTRACTION PHACO AND INTRAOCULAR LENS PLACEMENT LEFT EYE;  Surgeon: Fabio Pierce, MD;  Location: [facility];  Service: Ophthalmology;  Laterality: Left;  CDE: 5.79   CHOLECYSTECTOMY N/A 07/20/2014   Procedure: LAPAROSCOPIC CHOLECYSTECTOMY;  Surgeon: Dalia Heading, MD;  Location: [facility];  Service: General;  Laterality: N/A;   EYE SURGERY Left    45 yrs ago- eye "went out of focus". x2   MYOMECTOMY     Women's- 15 yrs ago   Family History  Adopted: Yes  Problem Relation Age of Onset   Colon cancer Father 11   Alzheimer's disease Father    Thyroid disease Sister    Diabetes Sister    Hypertension Sister    Liver disease Neg Hx    Pancreatic disease Neg Hx    Stomach cancer Neg Hx    Esophageal cancer Neg Hx    Social History   Socioeconomic History   Marital status: Single    Spouse name: Not on file   Number of children: 0   Years of education: Not on file   Highest education level: Not on file  Occupational History   Occupation: CNA - Artist    Employer: [employer]   Tobacco Use   Smoking status: Never    Passive exposure:  Never   Smokeless tobacco: Never  Vaping Use   Vaping Use: Never used  Substance and Sexual Activity   Alcohol use: No   Drug use: No   Sexual activity: Not Currently    Partners: Male    Birth control/protection: Post-menopausal  Other Topics Concern   Not on file  Social History Narrative   Not on file   Social Determinants of Health   Financial Resource Strain: Not on file  Food Insecurity: Not on file  Transportation Needs: Not on file  Physical Activity: Not on file  Stress: Not on file   Social Connections: Not on file  Intimate Partner Violence: Not on file    SUBJECTIVE  Review of Systems Constitutional: Patient ***denies any unintentional weight loss or change in strength lntegumentary: Patient ***denies any rashes or pruritus Eyes: Patient denies ***dry eyes ENT: Patient ***denies dry mouth Cardiovascular: Patient ***denies chest pain or syncope Respiratory: Patient ***denies shortness of breath Gastrointestinal: Patient ***denies nausea, vomiting, constipation, or diarrhea Musculoskeletal: Patient ***denies muscle cramps or weakness Neurologic: Patient ***denies convulsions or seizures Psychiatric: Patient ***denies memory problems Allergic/Immunologic: Patient ***denies recent allergic reaction(s) Hematologic/Lymphatic: Patient denies bleeding tendencies Endocrine: Patient ***denies heat/cold intolerance  GU: As per HPI.  OBJECTIVE There were no vitals filed for this visit. There is no height or weight on file to calculate BMI.  Physical Examination  Constitutional: General appearance: Well nourished, well developed in no acute distress. Psych: Normal mood and affect, alert and oriented x 3 Neck: Supple, normal appearance.  Respiratory: Normal respiratory effort.  Cardiovascular: No lower extremity edema.  Skin: Warm and dry, no lesions. Lymphatic: No inguinal lymphadenopathy. Abdomen: No masses. No abdominal tenderness. No hernias. No hepatosplenomegaly. No guarding or rebound. Surgical scars ***Pfannensteil / ***laparoscopic port sites/***.  Detailed Urogynecologic Evaluation: Pelvic Exam: External genitalia  {desc; normal/abnormal:32111} in appearance and {Desc; negative/positive:13464} for erythema, lesions or discharge. Bartholin's and Skene's glands  in appearance. She has  sensation to light touch. ***She has an {Desc; intact/impaired:30302} anal wink. There {ACTION; IS/IS NOT:21021397} stool contamination of the perineum noted. Urethral meatus  midline and {Desc; negative/positive:13464}  for discharge, polyps, prolapse, caruncles, masses, or erythema. Urethra {Desc; negative/positive:13464}  for tenderness, masses, hypermobility, or leak with cough.  Bladder {Desc; negative/positive:13464}  for tenderness or distention. Vagina {Desc; negative/positive:13464}  for discharge, irritation or lesions. ***Atrophic. ***No prolapse. ***Levator diastasis. Uterus {Desc; negative/positive:13464}  for masses, enlargement or tenderness. ****Surgically absent.  Adnexa {Desc; negative/positive:13464}  for masses or tenderness.  Pelvic floor {ACTION; IS/IS UJW:11914782} hypertonic. Pelvic floor {ACTION; IS/IS NOT:21021397} tender to palpation.  POP-Q EXAM  Aa: ***  Ba: ***  Ap: ***  Bp: ***  C: ***  D: ***  Gh: ***  Pb: ***  TVL: ***  Staging  ***    Rectal Exam: Anal verge looks {desc; normal/abnormal:32111} {With/without:5700} hemorrhoids. Resting anal sphincter tone appears {desc; normal/abnormal:32111}. Squeeze sphincter tone appears {desc; normal/abnormal:32111}.  Enterocele / distal rectocele {DESC; PRESENT/ABSENT:17923}. Rectal masses {DESC; PRESENT/ABSENT:17923}. Dyssynergia {DESC; PRESENT/ABSENT:17923} when asking the patient to bear down.   Pelvic exam was chaperoned by ***.  UA: {Desc; negative/positive:13464} *** WBC/hpf, *** RBC/hpf, bacteria (***) *** nitrites, *** leukocytes, *** blood PVR: *** ml  ASSESSMENT No diagnosis found.  ***Recurrent UTls / ***History of UTls with insufficient urine culture data to formally validate recurrent UTI diagnosis: *** 2. ***Vaginal atrophy: ***  Will plan for follow up in *** months or sooner if needed. Pt verbalized understanding and agreement. All questions were answered.  PLAN Advised the following: 1. *** 2. ***No follow-ups on file.  No orders of the defined types were placed in this encounter.   It has been explained that the patient is to follow regularly  with their PCP in addition to all other providers involved in their care and to follow instructions provided by these respective offices. Patient advised to contact urology clinic if any urologic-pertaining questions, concerns, new symptoms or problems arise in the interim period.  There are no Patient Instructions on file for this visit.  Electronically signed by:  Donnita Falls, MSN, FNP-C, CUNP 04/06/2023 2:11 PM

## 2023-04-09 ENCOUNTER — Ambulatory Visit: Payer: BC Managed Care – PPO | Admitting: Urology

## 2023-04-09 ENCOUNTER — Encounter: Payer: Self-pay | Admitting: Urology

## 2023-04-09 VITALS — BP 131/83 | HR 79 | Temp 98.8°F

## 2023-04-09 DIAGNOSIS — N281 Cyst of kidney, acquired: Secondary | ICD-10-CM

## 2023-04-09 DIAGNOSIS — N941 Unspecified dyspareunia: Secondary | ICD-10-CM

## 2023-04-09 DIAGNOSIS — N952 Postmenopausal atrophic vaginitis: Secondary | ICD-10-CM

## 2023-04-09 DIAGNOSIS — Z87440 Personal history of urinary (tract) infections: Secondary | ICD-10-CM

## 2023-04-09 DIAGNOSIS — D849 Immunodeficiency, unspecified: Secondary | ICD-10-CM | POA: Insufficient documentation

## 2023-04-09 LAB — URINALYSIS, ROUTINE W REFLEX MICROSCOPIC
Bilirubin, UA: NEGATIVE
Glucose, UA: NEGATIVE
Ketones, UA: NEGATIVE
Nitrite, UA: NEGATIVE
RBC, UA: NEGATIVE
Specific Gravity, UA: 1.03 (ref 1.005–1.030)
Urobilinogen, Ur: 0.2 mg/dL (ref 0.2–1.0)
pH, UA: 5.5 (ref 5.0–7.5)

## 2023-04-09 LAB — BLADDER SCAN AMB NON-IMAGING: Scan Result: 0

## 2023-04-09 LAB — MICROSCOPIC EXAMINATION

## 2023-04-09 MED ORDER — ESTRADIOL 0.1 MG/GM VA CREA
TOPICAL_CREAM | VAGINAL | 3 refills | Status: AC
Start: 2023-04-09 — End: ?

## 2023-04-09 NOTE — Progress Notes (Signed)
post void residual=0 ?

## 2023-04-09 NOTE — Patient Instructions (Addendum)
Recommendations regarding UTI prevention / management:  When UTI symptoms occur: Call urology office to request order for urine culture. We recommend waiting for urine culture result prior to use of any antibiotics.  For bladder pain/ burning with urination: Over the counter Pyridium (phenazopyridine) as needed (commonly known under the "AZO" brand). No more than 3 days consecutively at a time due to risk for methemoglobinemia, liver function issues, and bone health damage with long term use of Pyridium.  Routine use for UTI prevention: - Topical vaginal estrogen for vaginal atrophy. Adequate fluid intake (>1.5 liters/day) to flush out the urinary tract. - Go to the bathroom to urinate every 4-6 hours while awake to minimize urinary stasis / bacterial overgrowth in the bladder. - Proanthocyanidin (PAC) supplement 36 mg daily; must be soluble (insoluble form of PAC will be ineffective). Recommended brand: Ellura. This is an over-the-counter supplement (often must be found/ purchased online) supplement derived from cranberries with concentrated active component: Proanthocyanidin (PAC) 36 mg daily. Decreases bacterial adherence to bladder lining. Not recommended for patients with interstitial cystitis due to acidity. - D-mannose powder (2 grams daily). This is an over-the-counter supplement which decreases bacterial adherence to bladder lining (it is a sugar that inhibits bacterial adherence to urothelial cells by binding to the pili of enteric bacteria). Take as per manufacturer recommendation. Can be used as an alternative or in addition to the concentrated cranberry supplement. Not recommended for diabetic patients due to sugar content. - Vitamin C supplement to acidify urine to minimize bacterial growth. Not recommended for patients with interstitial cystitis due to acidity. - Probiotic to maintain healthy vaginal microbiome to suppress bacteria at urethral opening. Brand recommendations: Darrold Junker  (includes probiotic & D-mannose ), Feminine Balance (highest concentration of lactobacillus) or Hyperbiotic Pro 15.  Note for patients with diabetes: You may read about D-mannose powder for UTI prevention. That is an over the-counter supplement which decreases bacterial adherence to bladder lining. I would NOT advise that for you as a person with diabetes due to its sugar content.       Lubricants and Moisturizers for Treating Genitourinary Syndrome of Menopause and Vulvovaginal Atrophy Treatment Comments I Available Products   Lubricants   Water-based Ingredients: Deionized water, glycerin, propylene glycol; latex safe; rare irritation; dry out with extended sexual activity Astroglide, Good Clean Love, K-Y Jelly, Natural, Organic, Pink, Sliquid, Sylk, Yes   Oil Based Ingredients: avocado, olive, peanut, corn; latex safe; can be used with silicone products; staining; safe (unless peanut allergy); non-irritating Coconut oil, vegetable oil, vitamin E oil   Silicone-Based Ingredients: Silicone polymers; staining; typically nonirritating, long lasting; waterproof; should not be used with silicone dilators, sexual toys, or gynecologic products Astroglide X, Oceanus Ultra Pure, Pink Silicone, Pjur Eros, Replens Silky Smooth, Silicone Premium JO, SKYN, Uberlube, [employer] Based Minimize harm to sperm motility; designed Astroglide TTC, Conceive Plus, Pre for couples trying to conceive Seed, Yes Baby   Fertility Friendly Minimize harm to sperm motility; designed Astroglide, TTC, Conceive Plus, Pre for couples trying to conceive Seed, Yes Baby   Vaginal Moisturizers   Vaginal Moisturizers For maintenance use 1 to 3 times weekly; can benefit women with dryness, chafing with AOL, and recurrent vaginal infections irrespective of sexual activity timing Balance Active Menopause Vaginal Moisturizing Lubricant, Canesintima Intimate Moisturizer, Replens, Rephresh, Sylk Natural  Intimate Moisturizer, Yes Vaginal Moisturizer    Hybrids Properties of both water and silicone-based products (combination of a vaginal lubricant and moisturizer); Non-irritating; good option for women  with allergies and sensitivities Lubrigyn, Luvena  Suppositories Hyaluronic acid to retain moisture Revaree  Vulvar Soothing Creams/Oils    Medicated CreamsP ain and burn relief; Ingredients: 4% Lidocaine, Aloe Vera gel Releveum ([place])  Non-Medicated Creams For anti-itch and moisture/maintenance; Ingredients: Coconut oil, Avocado oil, Shea Butter, Olive oil, Vitamin E Vajuvenate, Vmagic  Oils !For moisture/maintenance !Coconut oil, Vitamin E oil, Emu oil

## 2023-04-22 NOTE — Progress Notes (Deleted)
Office Visit Note  Patient: Kristine Young             Date of Birth: 12/20/1960           MRN: [id number]             PCP: Kirstie Peri, MD Referring: Kirstie Peri, MD Visit Date: 04/23/2023   Subjective:  No chief complaint on file.   History of Present Illness: Kristine Young is a 62 y.o. female here for follow up ***   Previous HPI 10/23/22 Kristine Young is a 62 y.o. female here for follow up limited scleroderma and osteoarthritis currently on HCQ 400 mg daily prednisone 2.5 mg daily. Since our last visit she feels symptoms are somewhat better that before. Not sure if this was related to work and life stressor changes at the time. Also had COVID illness last summer with high fevers and took a while to completely recover. She has noticed some worsening of knee pain with cold weather and has stiffness lasting about 30 minutes per day. No problems with raynaud's symptoms. She gets acid reflux symptoms only sometimes, takes pantoprazole intermittently for this.   Previous HPI 05/26/22 Kristine Young is a 62 y.o. female here for follow up for limited systemic sclerosis with cutaneous symptoms.  Since her last visit she tapered off the remaining prednisone stopping the 2.5 mg daily dose for about a month now.  Symptoms were doing pretty well but 2 to 3 weeks after ending the steroids she started developing diffuse joint pains and stiffness that have now been severe for 1 or 2 weeks.  Her hands and shoulders are the most severely affected and worst in her left arm.  She has not seen any visible swelling or changes in the affected areas.  No new skin changes observed.     Previous HPI Kristine Young is a 62 y.o. female here for follow up for limited systemic sclerosis with cutaneous symptoms on HCQ 400 mg daily and prednisone 5 mg daily attempted to taper steroid dose. Doing okay overall she still has itching on her leg and foot without new visible skin changes. She has noticed an area on  the right side of her neck starting to bother her and itching. She had bronchitis recently recovered had a lot of cough and shortness of breath. Also developed throat pain and choking on some foods and liquids, worst was a piece of hamburger. Started on pantoprazole for GERD for this.    Previous HPI 06/20/21 Kristine Young is a 62 y.o. female here for follow up for limited systemic sclerosis on HCQ 400 mg PO daily. She had taken prednisone 5 mg daily recommended to taper off this since our last follow up.  She decrease the prednisone to 2.5 mg daily without any immediate problems but in the past several weeks she has felt increased pain in multiple areas particularly in the back and bilateral hips.  She has noticed some sensation of skin tightness and itching over her distal legs.    Previous HPI  10/29/20 Kristine Young is a 62 y.o. female here for scleroderma. She takes hydroxychloroquine 400mg  daily and prednisone 10mg  daily for arthritis symptoms currently.  She was initially diagnosed by Dr. Octaviano Glow since about 2 years ago based on arthritis, skin changes, and high positive anti centromere Abs. Her arthritis involves multiple joints including bilateral shoulders, hands, knees, feet, and myalgias of the thighs. She lacks typical raynaud's symptoms or scleroderma  related skin thickening of the hands but does have changes with skin discoloration on her arms and legs.   She sees ophthalmology in [place] and had cataract surgery about 6 months ago. She reports up to 40 lb weight gain since this problem started. She has had an echocardiogram apparently no significant problems identified. She has had PFTs consistent with mild restrictive defect.   No Rheumatology ROS completed.   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   Osteoarthritis of both knees  10/23/2022   GERD (gastroesophageal reflux disease) 10/23/2022   Low back pain 06/20/2021   Proteinuria 05/03/2021   Limited systemic sclerosis (HCC) 10/29/2020   Chronic arthralgias of knees and hips 10/29/2020   Peripheral neuropathy 10/29/2020    Past Medical History:  Diagnosis Date   Anxiety    Arthritis    Cataract    Depression    Fibroid    Fibromyalgia    Hematoma    left buttocks due to fall/ from 2 years ago (2018)   Hypercholesterolemia    Hypertension    Neuropathy    Scleroderma (HCC)    Sleep apnea     Family History  Adopted: Yes  Problem Relation Age of Onset   Colon cancer Father 28   Alzheimer's disease Father    Thyroid disease Sister    Diabetes Sister    Hypertension Sister    Liver disease Neg Hx    Pancreatic disease Neg Hx    Stomach cancer Neg Hx    Esophageal cancer Neg Hx    Past Surgical History:  Procedure Laterality Date   ABDOMINAL SURGERY  1997   CATARACT EXTRACTION W/PHACO Right 03/22/2020   Procedure: CATARACT EXTRACTION PHACO AND INTRAOCULAR LENS PLACEMENT (IOC);  Surgeon: Fabio Pierce, MD;  Location: [facility];  Service: Ophthalmology;  Laterality: Right;  CDE: 5.94   CATARACT EXTRACTION W/PHACO Left 04/05/2020   Procedure: CATARACT EXTRACTION PHACO AND INTRAOCULAR LENS PLACEMENT LEFT EYE;  Surgeon: Fabio Pierce, MD;  Location: [facility];  Service: Ophthalmology;  Laterality: Left;  CDE: 5.79   CHOLECYSTECTOMY N/A 07/20/2014   Procedure: LAPAROSCOPIC CHOLECYSTECTOMY;  Surgeon: Dalia Heading, MD;  Location: [facility];  Service: General;  Laterality: N/A;   EYE SURGERY Left    45 yrs ago- eye "went out of focus". x2   MYOMECTOMY     Women's- 15 yrs ago   Social History   Social History Narrative   Not on file   Immunization History  Administered Date(s) Administered   [employer] Vaccination 12/09/2019, 01/06/2020   Tdap 10/20/2016     Objective: Vital Signs: There were no vitals taken for this visit.   Physical  Exam   Musculoskeletal Exam: ***  CDAI Exam: CDAI Score: -- Patient Global: --; Provider Global: -- Swollen: --; Tender: -- Joint Exam 04/23/2023   No joint exam has been documented for this visit   There is currently no information documented on the homunculus. Go to the Rheumatology activity and complete the homunculus joint exam.  Investigation: No additional findings.  Imaging: No results found.  Recent Labs: Lab Results  Component Value Date   WBC 8.3 10/23/2022   HGB 13.4 10/23/2022   PLT 268 10/23/2022   NA 142 10/23/2022   K 4.6 10/23/2022   CL 104 10/23/2022   CO2 29 10/23/2022   GLUCOSE 76 10/23/2022  BUN 13 10/23/2022   CREATININE 0.68 10/23/2022   BILITOT 0.5 10/23/2022   ALKPHOS 98 07/17/2014   AST 18 10/23/2022   ALT 22 10/23/2022   PROT 7.1 10/23/2022   ALBUMIN 4.2 07/17/2014   CALCIUM 10.2 10/23/2022   GFRAA 94 01/28/2021    Speciality Comments: PLQ Eye Exam 03/17/2021 normal My [facility] Dr. f/u 12 months  Procedures:  No procedures performed Allergies: Codeine and Tetracyclines & related   Assessment / Plan:     Visit Diagnoses: No diagnosis found.  ***  Orders: No orders of the defined types were placed in this encounter.  No orders of the defined types were placed in this encounter.    Follow-Up Instructions: No follow-ups on file.   Fuller Plan, MD  Note - This record has been created using [employer].  Chart creation errors have been sought, but may not always  have been located. Such creation errors do not reflect on  the standard of medical care.

## 2023-04-23 ENCOUNTER — Ambulatory Visit: Payer: BC Managed Care – PPO | Admitting: Internal Medicine

## 2023-05-04 NOTE — Progress Notes (Deleted)
Office Visit Note  Patient: Kristine Young             Date of Birth: 1961-06-07           MRN: [id number]             PCP: Kirstie Peri, MD Referring: Kirstie Peri, MD Visit Date: 05/10/2023   Subjective:  No chief complaint on file.   History of Present Illness: Kristine Young is a 62 y.o. female here for follow up for limited scleroderma and osteoarthritis currently on HCQ 400 mg daily prednisone 2.5 mg daily.    Previous HPI 10/23/2022 Kristine Young is a 62 y.o. female here for follow up limited scleroderma and osteoarthritis currently on HCQ 400 mg daily prednisone 2.5 mg daily. Since our last visit she feels symptoms are somewhat better that before. Not sure if this was related to work and life stressor changes at the time. Also had COVID illness last summer with high fevers and took a while to completely recover. She has noticed some worsening of knee pain with cold weather and has stiffness lasting about 30 minutes per day. No problems with raynaud's symptoms. She gets acid reflux symptoms only sometimes, takes pantoprazole intermittently for this.   Previous HPI 05/26/22 Kristine SCHAUMAN is a 63 y.o. female here for follow up for limited systemic sclerosis with cutaneous symptoms.  Since her last visit she tapered off the remaining prednisone stopping the 2.5 mg daily dose for about a month now.  Symptoms were doing pretty well but 2 to 3 weeks after ending the steroids she started developing diffuse joint pains and stiffness that have now been severe for 1 or 2 weeks.  Her hands and shoulders are the most severely affected and worst in her left arm.  She has not seen any visible swelling or changes in the affected areas.  No new skin changes observed.     Previous HPI Kristine Young is a 62 y.o. female here for follow up for limited systemic sclerosis with cutaneous symptoms on HCQ 400 mg daily and prednisone 5 mg daily attempted to taper steroid dose. Doing okay overall she  still has itching on her leg and foot without new visible skin changes. She has noticed an area on the right side of her neck starting to bother her and itching. She had bronchitis recently recovered had a lot of cough and shortness of breath. Also developed throat pain and choking on some foods and liquids, worst was a piece of hamburger. Started on pantoprazole for GERD for this.    Previous HPI 06/20/21 Kristine Young is a 62 y.o. female here for follow up for limited systemic sclerosis on HCQ 400 mg PO daily. She had taken prednisone 5 mg daily recommended to taper off this since our last follow up.  She decrease the prednisone to 2.5 mg daily without any immediate problems but in the past several weeks she has felt increased pain in multiple areas particularly in the back and bilateral hips.  She has noticed some sensation of skin tightness and itching over her distal legs.    Previous HPI  10/29/20 Kristine Young is a 62 y.o. female here for scleroderma. She takes hydroxychloroquine 400mg  daily and prednisone 10mg  daily for arthritis symptoms currently.  She was initially diagnosed by Dr. Octaviano Glow since about 2 years ago based on arthritis, skin changes, and high positive anti centromere Abs. Her arthritis involves multiple joints including bilateral shoulders,  hands, knees, feet, and myalgias of the thighs. She lacks typical raynaud's symptoms or scleroderma related skin thickening of the hands but does have changes with skin discoloration on her arms and legs.   She sees ophthalmology in [place] and had cataract surgery about 6 months ago. She reports up to 40 lb weight gain since this problem started. She has had an echocardiogram apparently no significant problems identified. She has had PFTs consistent with mild restrictive defect.   No Rheumatology ROS completed.   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   Osteoarthritis of both knees 10/23/2022   GERD (gastroesophageal reflux disease) 10/23/2022   Low back pain 06/20/2021   Proteinuria 05/03/2021   Limited systemic sclerosis (HCC) 10/29/2020   Chronic arthralgias of knees and hips 10/29/2020   Peripheral neuropathy 10/29/2020    Past Medical History:  Diagnosis Date   Anxiety    Arthritis    Cataract    Depression    Fibroid    Fibromyalgia    Hematoma    left buttocks due to fall/ from 2 years ago (2018)   Hypercholesterolemia    Hypertension    Neuropathy    Scleroderma (HCC)    Sleep apnea     Family History  Adopted: Yes  Problem Relation Age of Onset   Colon cancer Father 27   Alzheimer's disease Father    Thyroid disease Sister    Diabetes Sister    Hypertension Sister    Liver disease Neg Hx    Pancreatic disease Neg Hx    Stomach cancer Neg Hx    Esophageal cancer Neg Hx    Past Surgical History:  Procedure Laterality Date   ABDOMINAL SURGERY  1997   CATARACT EXTRACTION W/PHACO Right 03/22/2020   Procedure: CATARACT EXTRACTION PHACO AND INTRAOCULAR LENS PLACEMENT (IOC);  Surgeon: Fabio Pierce, MD;  Location: [facility];  Service: Ophthalmology;  Laterality: Right;  CDE: 5.94   CATARACT EXTRACTION W/PHACO Left 04/05/2020   Procedure: CATARACT EXTRACTION PHACO AND INTRAOCULAR LENS PLACEMENT LEFT EYE;  Surgeon: Fabio Pierce, MD;  Location: [facility];  Service: Ophthalmology;  Laterality: Left;  CDE: 5.79   CHOLECYSTECTOMY N/A 07/20/2014   Procedure: LAPAROSCOPIC CHOLECYSTECTOMY;  Surgeon: Dalia Heading, MD;  Location: [facility];  Service: General;  Laterality: N/A;   EYE SURGERY Left    45 yrs ago- eye "went out of focus". x2   MYOMECTOMY     Women's- 15 yrs ago   Social History   Social History Narrative   Not on file   Immunization History  Administered Date(s) Administered   [employer] Vaccination 12/09/2019, 01/06/2020   Tdap  10/20/2016     Objective: Vital Signs: There were no vitals taken for this visit.   Physical Exam   Musculoskeletal Exam: ***  CDAI Exam: CDAI Score: -- Patient Global: --; Provider Global: -- Swollen: --; Tender: -- Joint Exam 05/10/2023   No joint exam has been documented for this visit   There is currently no information documented on the homunculus. Go to the Rheumatology activity and complete the homunculus joint exam.  Investigation: No additional findings.  Imaging: No results found.  Recent Labs: Lab Results  Component Value Date   WBC 8.3 10/23/2022   HGB 13.4 10/23/2022   PLT 268 10/23/2022   NA 142 10/23/2022   K 4.6 10/23/2022  CL 104 10/23/2022   CO2 29 10/23/2022   GLUCOSE 76 10/23/2022   BUN 13 10/23/2022   CREATININE 0.68 10/23/2022   BILITOT 0.5 10/23/2022   ALKPHOS 98 07/17/2014   AST 18 10/23/2022   ALT 22 10/23/2022   PROT 7.1 10/23/2022   ALBUMIN 4.2 07/17/2014   CALCIUM 10.2 10/23/2022   GFRAA 94 01/28/2021    Speciality Comments: PLQ Eye Exam 03/17/2021 normal [facility] Dr. f/u 12 months  Procedures:  No procedures performed Allergies: Codeine and Tetracyclines & related   Assessment / Plan:     Visit Diagnoses: No diagnosis found.  ***  Orders: No orders of the defined types were placed in this encounter.  No orders of the defined types were placed in this encounter.    Follow-Up Instructions: No follow-ups on file.   Metta Clines, RT  Note - This record has been created using [employer].  Chart creation errors have been sought, but may not always  have been located. Such creation errors do not reflect on  the standard of medical care.

## 2023-05-10 ENCOUNTER — Ambulatory Visit: Payer: BC Managed Care – PPO | Admitting: Internal Medicine

## 2023-05-10 DIAGNOSIS — G8929 Other chronic pain: Secondary | ICD-10-CM

## 2023-05-10 DIAGNOSIS — K219 Gastro-esophageal reflux disease without esophagitis: Secondary | ICD-10-CM

## 2023-05-10 DIAGNOSIS — M170 Bilateral primary osteoarthritis of knee: Secondary | ICD-10-CM

## 2023-05-10 DIAGNOSIS — Z7952 Long term (current) use of systemic steroids: Secondary | ICD-10-CM

## 2023-05-10 DIAGNOSIS — M349 Systemic sclerosis, unspecified: Secondary | ICD-10-CM

## 2023-05-10 DIAGNOSIS — Z79899 Other long term (current) drug therapy: Secondary | ICD-10-CM

## 2023-05-21 ENCOUNTER — Ambulatory Visit: Payer: BC Managed Care – PPO | Admitting: Urology

## 2023-08-14 ENCOUNTER — Telehealth: Payer: Self-pay

## 2023-08-14 ENCOUNTER — Other Ambulatory Visit: Payer: Self-pay | Admitting: Internal Medicine

## 2023-08-14 DIAGNOSIS — M349 Systemic sclerosis, unspecified: Secondary | ICD-10-CM

## 2023-08-14 NOTE — Telephone Encounter (Signed)
Patient contacted the office to request a refill plaquenil. Already had a refill request in the box and denied the refill because the patient needs an appointment. Attempted to contact the patient to advise she needs to make an appointment. Left patient a message to call the office back.

## 2023-08-15 ENCOUNTER — Encounter: Payer: Self-pay | Admitting: Internal Medicine

## 2023-08-15 ENCOUNTER — Ambulatory Visit: Payer: BC Managed Care – PPO | Attending: Internal Medicine | Admitting: Internal Medicine

## 2023-08-15 ENCOUNTER — Other Ambulatory Visit: Payer: Self-pay

## 2023-08-15 VITALS — BP 137/81 | HR 78 | Resp 14 | Ht 62.5 in | Wt 196.0 lb

## 2023-08-15 DIAGNOSIS — M349 Systemic sclerosis, unspecified: Secondary | ICD-10-CM

## 2023-08-15 DIAGNOSIS — M810 Age-related osteoporosis without current pathological fracture: Secondary | ICD-10-CM

## 2023-08-15 DIAGNOSIS — G629 Polyneuropathy, unspecified: Secondary | ICD-10-CM

## 2023-08-15 DIAGNOSIS — M170 Bilateral primary osteoarthritis of knee: Secondary | ICD-10-CM

## 2023-08-15 DIAGNOSIS — E559 Vitamin D deficiency, unspecified: Secondary | ICD-10-CM | POA: Insufficient documentation

## 2023-08-15 MED ORDER — HYDROXYCHLOROQUINE SULFATE 200 MG PO TABS: 200.0000 mg | ORAL_TABLET | Freq: Two times a day (BID) | ORAL | 1 refills | Status: DC

## 2023-08-15 NOTE — Telephone Encounter (Signed)
Patient contacted the office requesting a refill of Plaquenil be sent to [facility] in [place].   Last Fill: 10/23/2022  Eye exam: 03/17/2021 normal   Labs: 07/25/2023 CBC normal 04/10/2023 CMP  Glucose 101 ALT 36  Next Visit: 08/15/2023  Last Visit: 10/23/2022  ON:[id number] systemic sclerosis   Current Dose per office note 10/23/2022: not mentioned  Contacted the patient and advised she needed to make an appointment and needs an updated PLQ eye exam. Patient made an appointment for today. Patient states she has had an updated PLQ eye exam and will have the eye doctor fax the results.   Okay to refill Plaquenil?

## 2023-08-15 NOTE — Telephone Encounter (Signed)
Contacted the patient and advised she needed to make an appointment and needs an updated PLQ eye exam. Patient made an appointment for today. Patient states she has had an updated PLQ eye exam and will have the eye doctor fax the results.

## 2023-08-15 NOTE — Patient Instructions (Signed)

## 2023-08-15 NOTE — Progress Notes (Signed)
Office Visit Note  Patient: Kristine Young             Date of Birth: [birth year]           MRN: [id number]             PCP: Kirstie Peri, MD Referring: Kirstie Peri, MD Visit Date: 08/15/2023   Subjective:  Follow-up (Patient states her feet and hands hurt. )   History of Present Illness: Kristine Young is a 62 y.o. female here for follow up for limited cutaneous scleroderma and osteoarthritis currently on hydroxychloroquine 400 mg daily.  She has overall been doing fairly well without major medical changes.  She discontinued the remaining low-dose prednisone since her last visit.  Has noticed some increased pain and stiffness especially involving both hands and feet.  Not with any new visible swelling.  Does not see any new skin rash or discolored areas.  Previous HPI 10/23/22 Kristine Young is a 62 y.o. female here for follow up limited scleroderma and osteoarthritis currently on HCQ 400 mg daily prednisone 2.5 mg daily. Since our last visit she feels symptoms are somewhat better that before. Not sure if this was related to work and life stressor changes at the time. Also had COVID illness last summer with high fevers and took a while to completely recover. She has noticed some worsening of knee pain with cold weather and has stiffness lasting about 30 minutes per day. No problems with raynaud's symptoms. She gets acid reflux symptoms only sometimes, takes pantoprazole intermittently for this.   Previous HPI 05/26/22 Kristine Young is a 62 y.o. female here for follow up for limited systemic sclerosis with cutaneous symptoms.  Since her last visit she tapered off the remaining prednisone stopping the 2.5 mg daily dose for about a month now.  Symptoms were doing pretty well but 2 to 3 weeks after ending the steroids she started developing diffuse joint pains and stiffness that have now been severe for 1 or 2 weeks.  Her hands and shoulders are the most severely affected and worst in her  left arm.  She has not seen any visible swelling or changes in the affected areas.  No new skin changes observed.     Previous HPI Kristine Young is a 62 y.o. female here for follow up for limited systemic sclerosis with cutaneous symptoms on HCQ 400 mg daily and prednisone 5 mg daily attempted to taper steroid dose. Doing okay overall she still has itching on her leg and foot without new visible skin changes. She has noticed an area on the right side of her neck starting to bother her and itching. She had bronchitis recently recovered had a lot of cough and shortness of breath. Also developed throat pain and choking on some foods and liquids, worst was a piece of hamburger. Started on pantoprazole for GERD for this.    Previous HPI 06/20/21 Kristine Young is a 62 y.o. female here for follow up for limited systemic sclerosis on HCQ 400 mg PO daily. She had taken prednisone 5 mg daily recommended to taper off this since our last follow up.  She decrease the prednisone to 2.5 mg daily without any immediate problems but in the past several weeks she has felt increased pain in multiple areas particularly in the back and bilateral hips.  She has noticed some sensation of skin tightness and itching over her distal legs.    Previous HPI  10/29/20 Geroge Young  Reha is a 62 y.o. female here for scleroderma. She takes hydroxychloroquine 400mg  daily and prednisone 10mg  daily for arthritis symptoms currently.  She was initially diagnosed by Dr. Octaviano Glow since about 2 years ago based on arthritis, skin changes, and high positive anti centromere Abs. Her arthritis involves multiple joints including bilateral shoulders, hands, knees, feet, and myalgias of the thighs. She lacks typical raynaud's symptoms or scleroderma related skin thickening of the hands but does have changes with skin discoloration on her arms and legs.   She sees ophthalmology in [place] and had cataract surgery about 6 months ago. She reports up to  40 lb weight gain since this problem started. She has had an echocardiogram apparently no significant problems identified. She has had PFTs consistent with mild restrictive defect.   Review of Systems  Constitutional:  Positive for fatigue.  HENT:  Positive for mouth dryness. Negative for mouth sores.   Eyes:  Negative for dryness.  Respiratory:  Negative for shortness of breath.   Cardiovascular:  Negative for chest pain and palpitations.  Gastrointestinal:  Positive for constipation. Negative for blood in stool and diarrhea.  Endocrine: Negative for increased urination.  Genitourinary:  Negative for involuntary urination.  Musculoskeletal:  Positive for joint pain, joint pain, joint swelling and morning stiffness. Negative for gait problem, myalgias, muscle weakness, muscle tenderness and myalgias.  Skin:  Positive for color change. Negative for rash, hair loss and sensitivity to sunlight.  Allergic/Immunologic: Negative for susceptible to infections.  Neurological:  Negative for dizziness and headaches.  Hematological:  Negative for swollen glands.  Psychiatric/Behavioral:  Positive for depressed mood and sleep disturbance. The patient is nervous/anxious.     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   Osteoarthritis of both knees 10/23/2022   GERD (gastroesophageal reflux disease) 10/23/2022   Low back pain 06/20/2021   Proteinuria 05/03/2021   Limited systemic sclerosis (HCC) 10/29/2020   Chronic arthralgias of knees and hips 10/29/2020   Peripheral neuropathy 10/29/2020    Past Medical History:  Diagnosis Date   Anxiety    Arthritis    Cataract    Depression    Fibroid    Fibromyalgia    Hematoma    left buttocks due to  fall/ from 2 years ago (2018)   Hypercholesterolemia    Hypertension    Neuropathy    Scleroderma (HCC)    Sleep apnea     Family History  Adopted: Yes  Problem Relation Age of Onset   Colon cancer Father 1   Alzheimer's disease Father    Thyroid disease Sister    Diabetes Sister    Hypertension Sister    Liver disease Neg Hx    Pancreatic disease Neg Hx    Stomach cancer Neg Hx    Esophageal cancer Neg Hx    Past Surgical History:  Procedure Laterality Date   ABDOMINAL SURGERY  1997   CATARACT EXTRACTION W/PHACO Right 03/22/2020   Procedure: CATARACT EXTRACTION PHACO AND INTRAOCULAR LENS PLACEMENT (IOC);  Surgeon: Fabio Pierce, MD;  Location: [facility];  Service: Ophthalmology;  Laterality: Right;  CDE: 5.94   CATARACT EXTRACTION W/PHACO Left 04/05/2020   Procedure: CATARACT EXTRACTION PHACO AND INTRAOCULAR LENS PLACEMENT LEFT EYE;  Surgeon: Fabio Pierce, MD;  Location: [facility];  Service: Ophthalmology;  Laterality: Left;  CDE: 5.79   CHOLECYSTECTOMY N/A 07/20/2014   Procedure: LAPAROSCOPIC CHOLECYSTECTOMY;  Surgeon: Dalia Heading, MD;  Location: [facility];  Service: General;  Laterality: N/A;   EYE SURGERY Left    45 yrs ago- eye "went out of focus". x2   MYOMECTOMY     Women's- 15 yrs ago   Social History   Social History Narrative   Not on file   Immunization History  Administered Date(s) Administered   [employer] Vaccination 12/09/2019, 01/06/2020   Tdap 10/20/2016     Objective: Vital Signs: BP 137/81 (BP Location: Right Arm, Patient Position: Sitting, Cuff Size: Normal)   Pulse 78   Resp 14   Ht 5' 2.5" (1.588 m)   Wt 196 lb (88.9 kg)   BMI 35.28 kg/m    Physical Exam Eyes:     Conjunctiva/sclera: Conjunctivae normal.  Cardiovascular:     Rate and Rhythm: Normal rate and regular rhythm.  Pulmonary:     Effort: Pulmonary effort is normal.     Breath sounds: Normal breath sounds.  Lymphadenopathy:     Cervical: No cervical adenopathy.   Skin:    General: Skin is warm and dry.     Findings: Rash present.     Comments: Mild hypopigmentation and some dorsal areas of hand and fingers, no sclerodactyly Chronic skin thickening in both lower legs with linear excoriations  Neurological:     Mental Status: She is alert.  Psychiatric:        Mood and Affect: Mood normal.      Musculoskeletal Exam:  Shoulders full ROM no tenderness or swelling Elbows full ROM no tenderness or swelling Wrists full ROM no tenderness or swelling Fingers full ROM no tenderness or swelling Hip normal internal and external rotation without pain, no tenderness to lateral hip palpation Knees full ROM no tenderness or swelling, bilateral patellofemoral crepitus   Investigation: No additional findings.  Imaging: No results found.  Recent Labs: Lab Results  Component Value Date   WBC 8.3 10/23/2022   HGB 13.4 10/23/2022   PLT 268 10/23/2022   NA 142 10/23/2022   K 4.6 10/23/2022   CL 104 10/23/2022   CO2 29 10/23/2022   GLUCOSE 76 10/23/2022   BUN 13 10/23/2022   CREATININE 0.68 10/23/2022   BILITOT 0.5 10/23/2022   ALKPHOS 98 07/17/2014   AST 18 10/23/2022   ALT 22 10/23/2022   PROT 7.1 10/23/2022   ALBUMIN 4.2 07/17/2014   CALCIUM 10.2 10/23/2022   GFRAA 94 01/28/2021    Speciality Comments: PLQ Eye Exam 03/17/2021 normal My [facility] Dr. f/u 12 months  Contacted the patient's eye doctor, states they will fax it over  [facility] [address], [place]  Procedures:  No procedures performed Allergies: Codeine and Tetracyclines & related   Assessment / Plan:     Visit Diagnoses: Limited systemic sclerosis (HCC) - Plan: hydroxychloroquine (PLAQUENIL) 200 MG tablet, Sedimentation rate, CBC with Differential/Platelet, COMPLETE METABOLIC PANEL WITH GFR  Checking CBC and CMP for medication monitoring on continued long-term use of hydroxychloroquine also with as needed Tylenol and diuretics for hypertension.  Also recheck  sedimentation rate monitoring for any inflammatory activity but suspect this is normal.  She feels symptoms are manageable and preferred not for resuming any maintenance steroid at this time.  Plan to continue hydroxychloroquine 200 mg twice daily.  Peripheral polyneuropathy  Is on Cymbalta 60 mg twice daily.  No severe sensory deficit.  Primary osteoarthritis of both knees  Discussed knee pain appears more related to structural wear-and-tear type arthritis.  Provided printed handout on supplement treatment options to consider addition to her current regimen.  Vitamin D deficiency - Plan: VITAMIN D 25 Hydroxy (Vit-D Deficiency, Fractures) Age-related osteoporosis without current pathological fracture  Checking vitamin D level for adequacy at this time.  Discussed importance for this with regard to symptoms of arthritis, osteoporosis, and autoimmune disease activity.  Orders: Orders Placed This Encounter  Procedures   Sedimentation rate   CBC with Differential/Platelet   COMPLETE METABOLIC PANEL WITH GFR   VITAMIN D 25 Hydroxy (Vit-D Deficiency, Fractures)   Meds ordered this encounter  Medications   hydroxychloroquine (PLAQUENIL) 200 MG tablet    Sig: Take 1 tablet (200 mg total) by mouth 2 (two) times daily.    Dispense:  180 tablet    Refill:  1     Follow-Up Instructions: Return in about 6 months (around 02/12/2024) for LtdSSc/OA on HCQ f/u 6mos.   Fuller Plan, MD  Note - This record has been created using [employer].  Chart creation errors have been sought, but may not always  have been located. Such creation errors do not reflect on  the standard of medical care.

## 2023-08-16 LAB — COMPLETE METABOLIC PANEL WITH GFR
AG Ratio: 1.7 (calc) (ref 1.0–2.5)
ALT: 19 U/L (ref 6–29)
AST: 18 U/L (ref 10–35)
Albumin: 4.1 g/dL (ref 3.6–5.1)
Alkaline phosphatase (APISO): 79 U/L (ref 37–153)
BUN: 14 mg/dL (ref 7–25)
CO2: 28 mmol/L (ref 20–32)
Calcium: 9.3 mg/dL (ref 8.6–10.4)
Chloride: 103 mmol/L (ref 98–110)
Creat: 0.75 mg/dL (ref 0.50–1.05)
Globulin: 2.4 g/dL (ref 1.9–3.7)
Glucose, Bld: 93 mg/dL (ref 65–99)
Potassium: 3.8 mmol/L (ref 3.5–5.3)
Sodium: 141 mmol/L (ref 135–146)
Total Bilirubin: 0.3 mg/dL (ref 0.2–1.2)
Total Protein: 6.5 g/dL (ref 6.1–8.1)
eGFR: 89 mL/min/{1.73_m2} (ref >=60)

## 2023-08-16 LAB — CBC WITH DIFFERENTIAL/PLATELET
Absolute Lymphocytes: 2618 {cells}/uL (ref 850–3900)
Absolute Monocytes: 592 {cells}/uL (ref 200–950)
Basophils Absolute: 61 {cells}/uL (ref 0–200)
Basophils Relative: 0.9 %
Eosinophils Absolute: 177 {cells}/uL (ref 15–500)
Eosinophils Relative: 2.6 %
HCT: 38.5 % (ref 35.0–45.0)
Hemoglobin: 12.6 g/dL (ref 11.7–15.5)
MCH: 30.8 pg (ref 27.0–33.0)
MCHC: 32.7 g/dL (ref 32.0–36.0)
MCV: 94.1 fL (ref 80.0–100.0)
MPV: 11.4 fL (ref 7.5–12.5)
Monocytes Relative: 8.7 %
Neutro Abs: 3352 {cells}/uL (ref 1500–7800)
Neutrophils Relative %: 49.3 %
Platelets: 249 10*3/uL (ref 140–400)
RBC: 4.09 10*6/uL (ref 3.80–5.10)
RDW: 11.9 % (ref 11.0–15.0)
Total Lymphocyte: 38.5 %
WBC: 6.8 10*3/uL (ref 3.8–10.8)

## 2023-08-16 LAB — SEDIMENTATION RATE: Sed Rate: 6 mm/h (ref 0–30)

## 2023-08-16 LAB — VITAMIN D 25 HYDROXY (VIT D DEFICIENCY, FRACTURES): Vit D, 25-Hydroxy: 12 ng/mL — ABNORMAL LOW (ref 30–100)

## 2023-08-20 ENCOUNTER — Other Ambulatory Visit: Payer: Self-pay | Admitting: *Deleted

## 2023-08-20 DIAGNOSIS — E559 Vitamin D deficiency, unspecified: Secondary | ICD-10-CM

## 2023-08-20 MED ORDER — VITAMIN D (ERGOCALCIFEROL) 1.25 MG (50000 UNIT) PO CAPS: 50000.0000 [IU] | ORAL_CAPSULE | ORAL | 0 refills | Status: AC

## 2023-12-04 ENCOUNTER — Other Ambulatory Visit: Payer: Self-pay | Admitting: Internal Medicine

## 2023-12-04 DIAGNOSIS — Z1231 Encounter for screening mammogram for malignant neoplasm of breast: Secondary | ICD-10-CM

## 2023-12-11 ENCOUNTER — Inpatient Hospital Stay: Admission: RE | Admit: 2023-12-11 | Payer: BC Managed Care – PPO | Source: Ambulatory Visit

## 2024-01-30 NOTE — Progress Notes (Deleted)
 Office Visit Note  Patient: Kristine Young             Date of Birth: [birth year]           MRN: [id number]             PCP: Theoplis Fix, MD Referring: Theoplis Fix, MD Visit Date: 02/12/2024   Subjective:  No chief complaint on file.   History of Present Illness: Kristine Young is a 63 y.o. female here for follow up for limited cutaneous scleroderma and osteoarthritis currently on hydroxychloroquine  400 mg daily.    Previous HPI 08/15/2023 Kristine Young is a 63 y.o. female here for follow up for limited cutaneous scleroderma and osteoarthritis currently on hydroxychloroquine  400 mg daily.  She has overall been doing fairly well without major medical changes.  She discontinued the remaining low-dose prednisone  since her last visit.  Has noticed some increased pain and stiffness especially involving both hands and feet.  Not with any new visible swelling.  Does not see any new skin rash or discolored areas.   Previous HPI 10/23/22 Kristine Young is a 63 y.o. female here for follow up limited scleroderma and osteoarthritis currently on HCQ 400 mg daily prednisone  2.5 mg daily. Since our last visit she feels symptoms are somewhat better that before. Not sure if this was related to work and life stressor changes at the time. Also had COVID illness last summer with high fevers and took a while to completely recover. She has noticed some worsening of knee pain with cold weather and has stiffness lasting about 30 minutes per day. No problems with raynaud's symptoms. She gets acid reflux symptoms only sometimes, takes pantoprazole  intermittently for this.   Previous HPI 05/26/22 Kristine Young is a 63 y.o. female here for follow up for limited systemic sclerosis with cutaneous symptoms.  Since her last visit she tapered off the remaining prednisone  stopping the 2.5 mg daily dose for about a month now.  Symptoms were doing pretty well but 2 to 3 weeks after ending the steroids she started  developing diffuse joint pains and stiffness that have now been severe for 1 or 2 weeks.  Her hands and shoulders are the most severely affected and worst in her left arm.  She has not seen any visible swelling or changes in the affected areas.  No new skin changes observed.     Previous HPI Kristine Young is a 63 y.o. female here for follow up for limited systemic sclerosis with cutaneous symptoms on HCQ 400 mg daily and prednisone  5 mg daily attempted to taper steroid dose. Doing okay overall she still has itching on her leg and foot without new visible skin changes. She has noticed an area on the right side of her neck starting to bother her and itching. She had bronchitis recently recovered had a lot of cough and shortness of breath. Also developed throat pain and choking on some foods and liquids, worst was a piece of hamburger. Started on pantoprazole  for GERD for this.    Previous HPI 06/20/21 Kristine Young is a 64 y.o. female here for follow up for limited systemic sclerosis on HCQ 400 mg PO daily. She had taken prednisone  5 mg daily recommended to taper off this since our last follow up.  She decrease the prednisone  to 2.5 mg daily without any immediate problems but in the past several weeks she has felt increased pain in multiple areas particularly in the back  and bilateral hips.  She has noticed some sensation of skin tightness and itching over her distal legs.    Previous HPI  10/29/20 Kristine Young is a 63 y.o. female here for scleroderma. She takes hydroxychloroquine  400mg  daily and prednisone  10mg  daily for arthritis symptoms currently.  She was initially diagnosed by Dr. Raynaldo Call since about 2 years ago based on arthritis, skin changes, and high positive anti centromere Abs. Her arthritis involves multiple joints including bilateral shoulders, hands, knees, feet, and myalgias of the thighs. She lacks typical raynaud's symptoms or scleroderma related skin thickening of the hands but does  have changes with skin discoloration on her arms and legs.   She sees ophthalmology in [place] and had cataract surgery about 6 months ago. She reports up to 40 lb weight gain since this problem started. She has had an echocardiogram apparently no significant problems identified. She has had PFTs consistent with mild restrictive defect.   No Rheumatology ROS completed.   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   Osteoarthritis of both knees 10/23/2022   GERD (gastroesophageal reflux disease) 10/23/2022   Low back pain 06/20/2021   Proteinuria 05/03/2021   Limited systemic sclerosis (HCC) 10/29/2020   Chronic arthralgias of knees and hips 10/29/2020   Peripheral neuropathy 10/29/2020    Past Medical History:  Diagnosis Date   Anxiety    Arthritis    Cataract    Depression    Fibroid    Fibromyalgia    Hematoma    left buttocks due to fall/ from 2 years ago (2018)   Hypercholesterolemia    Hypertension    Neuropathy    Scleroderma (HCC)    Sleep apnea     Family History  Adopted: Yes  Problem Relation Age of Onset   Colon cancer Father 50   Alzheimer's disease Father    Thyroid disease Sister    Diabetes Sister    Hypertension Sister    Liver disease Neg Hx    Pancreatic disease Neg Hx    Stomach cancer Neg Hx    Esophageal cancer Neg Hx    Past Surgical History:  Procedure Laterality Date   ABDOMINAL SURGERY  1997   CATARACT EXTRACTION W/PHACO Right 03/22/2020   Procedure: CATARACT EXTRACTION PHACO AND INTRAOCULAR LENS PLACEMENT (IOC);  Surgeon: Tarri Farm, MD;  Location: [facility];  Service: Ophthalmology;  Laterality: Right;  CDE: 5.94   CATARACT EXTRACTION W/PHACO Left 04/05/2020   Procedure: CATARACT  EXTRACTION PHACO AND INTRAOCULAR LENS PLACEMENT LEFT EYE;  Surgeon: Tarri Farm, MD;  Location: [facility];  Service: Ophthalmology;  Laterality: Left;  CDE: 5.79   CHOLECYSTECTOMY N/A 07/20/2014   Procedure: LAPAROSCOPIC CHOLECYSTECTOMY;  Surgeon: Beau Bound, MD;  Location: [facility];  Service: General;  Laterality: N/A;   EYE SURGERY Left    45 yrs ago- eye "went out of focus". x2   MYOMECTOMY     [facility]- 15 yrs ago   Social History   Social History Narrative   Not on file   Immunization History  Administered Date(s) Administered   Moderna Sars-Covid-2 Vaccination 12/09/2019, 01/06/2020   Tdap 10/20/2016     Objective: Vital Signs: There were no vitals taken for this visit.   Physical Exam   Musculoskeletal Exam: ***  CDAI Exam: CDAI Score: -- Patient Global: --; Provider Global: -- Swollen: --; Tender: -- Joint Exam 02/12/2024   No joint exam has been documented for this visit   There is currently no information documented on the homunculus. Go to the Rheumatology activity and complete the homunculus joint exam.  Investigation: No additional findings.  Imaging: No results found.  Recent Labs: Lab Results  Component Value Date   WBC 6.8 08/15/2023   HGB 12.6 08/15/2023   PLT 249 08/15/2023   NA 141 08/15/2023   K 3.8 08/15/2023   CL 103 08/15/2023   CO2 28 08/15/2023   GLUCOSE 93 08/15/2023   BUN 14 08/15/2023   CREATININE 0.75 08/15/2023   BILITOT 0.3 08/15/2023   ALKPHOS 98 07/17/2014   AST 18 08/15/2023   ALT 19 08/15/2023   PROT 6.5 08/15/2023   ALBUMIN 4.2 07/17/2014   CALCIUM  9.3 08/15/2023   GFRAA 94 01/28/2021    Speciality Comments: PLQ Eye Exam 03/26/2023 normal [facility] Dr. f/u 12 months  Procedures:  No procedures performed Allergies: Codeine and Tetracyclines & related   Assessment / Plan:     Visit Diagnoses: No diagnosis found.  ***  Orders: No orders of the defined types were placed in this encounter.  No orders of the  defined types were placed in this encounter.    Follow-Up Instructions: No follow-ups on file.   Glena Landau, RT  Note - This record has been created using [employer].  Chart creation errors have been sought, but may not always  have been located. Such creation errors do not reflect on  the standard of medical care.

## 2024-02-12 ENCOUNTER — Ambulatory Visit: Payer: BC Managed Care – PPO | Admitting: Internal Medicine

## 2024-02-12 DIAGNOSIS — M810 Age-related osteoporosis without current pathological fracture: Secondary | ICD-10-CM

## 2024-02-12 DIAGNOSIS — G629 Polyneuropathy, unspecified: Secondary | ICD-10-CM

## 2024-02-12 DIAGNOSIS — E559 Vitamin D deficiency, unspecified: Secondary | ICD-10-CM

## 2024-02-12 DIAGNOSIS — M170 Bilateral primary osteoarthritis of knee: Secondary | ICD-10-CM

## 2024-02-12 DIAGNOSIS — M349 Systemic sclerosis, unspecified: Secondary | ICD-10-CM

## 2024-02-12 DIAGNOSIS — Z79899 Other long term (current) drug therapy: Secondary | ICD-10-CM

## 2024-03-17 NOTE — Progress Notes (Signed)
 Office Visit Note  Patient: Kristine Young             Date of Birth: 1961-01-20           MRN: [id number]             PCP: Maree Isles, MD Referring: Maree Isles, MD Visit Date: 03/31/2024   Subjective:  Follow-up (Patient states she does still have pain in her left elbow due to bone spurs. )   Discussed the use of AI scribe software for clinical note transcription with the patient, who gave verbal consent to proceed.  History of Present Illness   Kristine Young is a 63 y.o. female here for follow up for limited cutaneous scleroderma and osteoarthritis currently on hydroxychloroquine  400 mg daily.   She has a history of vitamin D  deficiency with previous levels recorded at 12 ng/mL. She was prescribed a high-dose vitamin D  supplement for twelve weeks but is uncertain if she completed the course. No recent blood tests have been conducted since November, and she prefers to have her lab work done at the [facility].  She has osteoarthritis affecting her knees, hips, and left elbow. She experiences limited rotation in her right hip and notes clicking and grinding in her left knee. Her left elbow does not fully straighten. Currently, her knees are not causing significant discomfort. Her boyfriend encourages her to exercise.  No new skin changes, rashes, color changes, swelling, or recent serious infections. She saw her primary care provider last week for blood pressure management but did not have any blood tests done at that time.      Previous HPI 08/15/2023 Kristine Young is a 63 y.o. female here for follow up for limited cutaneous scleroderma and osteoarthritis currently on hydroxychloroquine  400 mg daily.  She has overall been doing fairly well without major medical changes.  She discontinued the remaining low-dose prednisone  since her last visit.  Has noticed some increased pain and stiffness especially involving both hands and feet.  Not with any new visible swelling.  Does  not see any new skin rash or discolored areas.   Previous HPI 10/23/22 Kristine Young is a 63 y.o. female here for follow up limited scleroderma and osteoarthritis currently on HCQ 400 mg daily prednisone  2.5 mg daily. Since our last visit she feels symptoms are somewhat better that before. Not sure if this was related to work and life stressor changes at the time. Also had COVID illness last summer with high fevers and took a while to completely recover. She has noticed some worsening of knee pain with cold weather and has stiffness lasting about 30 minutes per day. No problems with raynaud's symptoms. She gets acid reflux symptoms only sometimes, takes pantoprazole  intermittently for this.   Previous HPI 05/26/22 Kristine Young is a 63 y.o. female here for follow up for limited systemic sclerosis with cutaneous symptoms.  Since her last visit she tapered off the remaining prednisone  stopping the 2.5 mg daily dose for about a month now.  Symptoms were doing pretty well but 2 to 3 weeks after ending the steroids she started developing diffuse joint pains and stiffness that have now been severe for 1 or 2 weeks.  Her hands and shoulders are the most severely affected and worst in her left arm.  She has not seen any visible swelling or changes in the affected areas.  No new skin changes observed.     Previous HPI Kristine Young  is a 63 y.o. female here for follow up for limited systemic sclerosis with cutaneous symptoms on HCQ 400 mg daily and prednisone  5 mg daily attempted to taper steroid dose. Doing okay overall she still has itching on her leg and foot without new visible skin changes. She has noticed an area on the right side of her neck starting to bother her and itching. She had bronchitis recently recovered had a lot of cough and shortness of breath. Also developed throat pain and choking on some foods and liquids, worst was a piece of hamburger. Started on pantoprazole  for GERD for this.     Previous HPI 06/20/21 Kristine Young is a 63 y.o. female here for follow up for limited systemic sclerosis on HCQ 400 mg PO daily. She had taken prednisone  5 mg daily recommended to taper off this since our last follow up.  She decrease the prednisone  to 2.5 mg daily without any immediate problems but in the past several weeks she has felt increased pain in multiple areas particularly in the back and bilateral hips.  She has noticed some sensation of skin tightness and itching over her distal legs.    Previous HPI  10/29/20 Kristine Young is a 63 y.o. female here for scleroderma. She takes hydroxychloroquine  400mg  daily and prednisone  10mg  daily for arthritis symptoms currently.  She was initially diagnosed by Dr. Fae since about 2 years ago based on arthritis, skin changes, and high positive anti centromere Abs. Her arthritis involves multiple joints including bilateral shoulders, hands, knees, feet, and myalgias of the thighs. She lacks typical raynaud's symptoms or scleroderma related skin thickening of the hands but does have changes with skin discoloration on her arms and legs.   She sees ophthalmology in [place] and had cataract surgery about 6 months ago. She reports up to 40 lb weight gain since this problem started. She has had an echocardiogram apparently no significant problems identified. She has had PFTs consistent with mild restrictive defect.   Review of Systems  Constitutional:  Negative for fatigue.  HENT:  Negative for mouth sores and mouth dryness.   Eyes:  Positive for dryness.  Respiratory:  Negative for shortness of breath.   Cardiovascular:  Negative for chest pain and palpitations.  Gastrointestinal:  Positive for constipation. Negative for blood in stool and diarrhea.  Endocrine: Negative for increased urination.  Genitourinary:  Negative for involuntary urination.  Musculoskeletal:  Positive for joint pain, gait problem, joint pain and morning stiffness. Negative  for joint swelling, myalgias, muscle weakness, muscle tenderness and myalgias.  Skin:  Positive for sensitivity to sunlight. Negative for color change, rash and hair loss.  Allergic/Immunologic: Negative for susceptible to infections.  Neurological:  Negative for dizziness and headaches.  Hematological:  Negative for swollen glands.  Psychiatric/Behavioral:  Positive for depressed mood and sleep disturbance. The patient is nervous/anxious.     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   Osteoarthritis of both knees 10/23/2022   GERD (gastroesophageal reflux disease) 10/23/2022   Low back pain 06/20/2021   Proteinuria 05/03/2021   Limited systemic sclerosis (HCC) 10/29/2020   Chronic arthralgias of knees and hips 10/29/2020   Peripheral neuropathy 10/29/2020    Past Medical History:  Diagnosis Date   Anxiety  Arthritis    Cataract    Depression    Fibroid    Fibromyalgia    Hematoma    left buttocks due to fall/ from 2 years ago (2018)   Hypercholesterolemia    Hypertension    Neuropathy    Scleroderma (HCC)    Sleep apnea     Family History  Adopted: Yes  Problem Relation Age of Onset   Colon cancer Father 84   Alzheimer's disease Father    Thyroid disease Sister    Diabetes Sister    Hypertension Sister    Liver disease Neg Hx    Pancreatic disease Neg Hx    Stomach cancer Neg Hx    Esophageal cancer Neg Hx    Past Surgical History:  Procedure Laterality Date   ABDOMINAL SURGERY  1997   CATARACT EXTRACTION W/PHACO Right 03/22/2020   Procedure: CATARACT EXTRACTION PHACO AND INTRAOCULAR LENS PLACEMENT (IOC);  Surgeon: Harrie Agent, MD;  Location: [facility];  Service: Ophthalmology;  Laterality: Right;  CDE: 5.94    CATARACT EXTRACTION W/PHACO Left 04/05/2020   Procedure: CATARACT EXTRACTION PHACO AND INTRAOCULAR LENS PLACEMENT LEFT EYE;  Surgeon: Harrie Agent, MD;  Location: [facility];  Service: Ophthalmology;  Laterality: Left;  CDE: 5.79   CHOLECYSTECTOMY N/A 07/20/2014   Procedure: LAPAROSCOPIC CHOLECYSTECTOMY;  Surgeon: Oneil DELENA Budge, MD;  Location: [facility];  Service: General;  Laterality: N/A;   EYE SURGERY Left    45 yrs ago- eye went out of focus. x2   MYOMECTOMY     Women's- 15 yrs ago   Social History   Social History Narrative   Not on file   Immunization History  Administered Date(s) Administered   Moderna Sars-Covid-2 Vaccination 12/09/2019, 01/06/2020   Tdap 10/20/2016     Objective: Vital Signs: BP 114/75 (BP Location: Left Arm, Patient Position: Sitting, Cuff Size: Large)   Pulse 87   Resp 14   Ht 5' 2.5 (1.588 m)   Wt 191 lb (86.6 kg)   BMI 34.38 kg/m    Physical Exam  Eyes:     Conjunctiva/sclera: Conjunctivae normal.    Cardiovascular:     Rate and Rhythm: Normal rate and regular rhythm.  Pulmonary:     Effort: Pulmonary effort is normal.     Breath sounds: Normal breath sounds.   Musculoskeletal:     Right lower leg: No edema.     Left lower leg: No edema.   Skin:    General: Skin is warm and dry.   Neurological:     Mental Status: She is alert.   Psychiatric:        Mood and Affect: Mood normal.      Musculoskeletal Exam:  Left elbow extension ROM, no palpable swelling Wrists full ROM no tenderness or swelling Fingers full ROM no tenderness or swelling Hip internal rotation ~25 degrees b/l, right lateral hip pain with full movement Knees full ROM, L>R knee patellofemoral crepitus Ankles full ROM no tenderness or swelling   Investigation: No additional findings.  Imaging: No results found.  Recent Labs: Lab Results  Component Value Date   WBC 6.8 08/15/2023   HGB 12.6 08/15/2023   PLT 249 08/15/2023   NA 141 08/15/2023   K 3.8  08/15/2023   CL 103 08/15/2023   CO2 28 08/15/2023   GLUCOSE 93 08/15/2023   BUN 14 08/15/2023   CREATININE 0.75 08/15/2023   BILITOT 0.3 08/15/2023   ALKPHOS 98 07/17/2014   AST 18 08/15/2023  ALT 19 08/15/2023   PROT 6.5 08/15/2023   ALBUMIN 4.2 07/17/2014   CALCIUM  9.3 08/15/2023   GFRAA 94 01/28/2021    Speciality Comments: PLQ Eye Exam 03/26/2023 normal My Eye Dr. f/u 12 months  Procedures:  No procedures performed Allergies: Hydrocodone, Codeine, and Tetracyclines & related   Assessment / Plan:     Visit Diagnoses: Limited systemic sclerosis (HCC) Skin disease appears to be doing very well main complaints are more joint related at this time.  Has followed up with Dr. Barbra for dermatology no major intervention required. - Continue hydroxychloroquine  400 mg daily  High risk medication use - hydroxychloroquine  200 mg twice daily. PLQ Eye Exam 03/26/2023 normal. Needs updated PLQ eye exam. - Plan: Comprehensive Metabolic Panel (CMET), CANCELED: Comprehensive Metabolic Panel (CMET) - Checking blood count metabolic panel for medication monitoring on hydroxychloroquine   Peripheral polyneuropathy - Cymbalta 60 mg twice daily  Vitamin D  deficiency - Plan: VITAMIN D  25 Hydroxy (Vit-D Deficiency, Fractures), CANCELED: VITAMIN D  25 Hydroxy (Vit-D Deficiency, Fractures) Previous Vitamin D  level at 12 indicates deficiency, increasing arthritis and osteoporosis risk. Discussed prevalence in African American women and benefits of supplementation for joint and immune health. - Recheck vitamin D  level to assess need for high-dose supplementation. - Consider 2000 units of vitamin D3 daily as maintenance.   Primary osteoarthritis of both knees Osteoarthritis Osteoarthritis affects multiple joints with degenerative changes. Discussed lack of regenerative treatments and emphasized lifestyle modifications for symptom relief. Explained benefits of weight loss, muscle strengthening, and smoking  cessation. - Recommend exercises: straight leg raises, wall sits, assisted squats. - Advise weight loss to reduce joint pressure. - Discuss importance of quitting smoking to reduce inflammation.  Vitamin D  deficiency     Orders: Orders Placed This Encounter  Procedures   Comprehensive Metabolic Panel (CMET)   Lipid panel   VITAMIN D  25 Hydroxy (Vit-D Deficiency, Fractures)   No orders of the defined types were placed in this encounter.    Follow-Up Instructions: Return in about 6 months (around 09/30/2024) for LtdSSc/OA on HCQ  f/u 6mos.   Lonni LELON Ester, MD  Note - This record has been created using [employer].  Chart creation errors have been sought, but may not always  have been located. Such creation errors do not reflect on  the standard of medical care.

## 2024-03-31 ENCOUNTER — Ambulatory Visit: Attending: Internal Medicine | Admitting: Internal Medicine

## 2024-03-31 ENCOUNTER — Encounter: Payer: Self-pay | Admitting: Internal Medicine

## 2024-03-31 VITALS — BP 114/75 | HR 87 | Resp 14 | Ht 62.5 in | Wt 191.0 lb

## 2024-03-31 DIAGNOSIS — M349 Systemic sclerosis, unspecified: Secondary | ICD-10-CM

## 2024-03-31 DIAGNOSIS — G629 Polyneuropathy, unspecified: Secondary | ICD-10-CM | POA: Diagnosis not present

## 2024-03-31 DIAGNOSIS — M810 Age-related osteoporosis without current pathological fracture: Secondary | ICD-10-CM

## 2024-03-31 DIAGNOSIS — M170 Bilateral primary osteoarthritis of knee: Secondary | ICD-10-CM

## 2024-03-31 DIAGNOSIS — Z79899 Other long term (current) drug therapy: Secondary | ICD-10-CM | POA: Diagnosis not present

## 2024-03-31 DIAGNOSIS — E785 Hyperlipidemia, unspecified: Secondary | ICD-10-CM

## 2024-03-31 DIAGNOSIS — E559 Vitamin D deficiency, unspecified: Secondary | ICD-10-CM

## 2024-04-06 MED ORDER — HYDROXYCHLOROQUINE SULFATE 200 MG PO TABS: 200.0000 mg | ORAL_TABLET | Freq: Two times a day (BID) | ORAL | 1 refills | Status: DC

## 2024-05-13 ENCOUNTER — Ambulatory Visit
Admission: RE | Admit: 2024-05-13 | Discharge: 2024-05-13 | Disposition: A | Discharge status: Home / Self Care | Source: Ambulatory Visit | Attending: Internal Medicine | Admitting: Internal Medicine

## 2024-05-13 DIAGNOSIS — Z1231 Encounter for screening mammogram for malignant neoplasm of breast: Secondary | ICD-10-CM

## 2024-08-12 ENCOUNTER — Other Ambulatory Visit: Payer: Self-pay

## 2024-08-12 ENCOUNTER — Telehealth: Payer: Self-pay

## 2024-08-12 DIAGNOSIS — M349 Systemic sclerosis, unspecified: Secondary | ICD-10-CM

## 2024-08-12 NOTE — Telephone Encounter (Signed)
 Contacted patient to advise that labs and a eye exam were needed. Upon speaking with patient she does not need a refill on her PLQ as she was unaware that one was at the pharmacy. Still advise patient that labs would be needed as well as they eye exam Patient said she had an eye exam a couple months ago an would call them to see why we have not been sent the results.

## 2024-08-12 NOTE — Telephone Encounter (Signed)
 Patient left a voicemail on the clinic line requesting a refill of plaquenil .

## 2024-08-31 DIAGNOSIS — S0300XA Dislocation of jaw, unspecified side, initial encounter: Secondary | ICD-10-CM

## 2024-08-31 HISTORY — DX: Dislocation of jaw, unspecified side, initial encounter: S03.00XA

## 2024-09-16 NOTE — Progress Notes (Signed)
 "  Office Visit Note  Patient: Kristine Young             Date of Birth: Feb 13, 1961           MRN: [id number]             PCP: Maree Isles, MD Referring: Maree Isles, MD Visit Date: 09/30/2024   Subjective:  Medical Management of Chronic Issues (TMJ diagnoses , and would like to see about a prescription of flexeril )   Discussed the use of AI scribe software for clinical note transcription with the patient, who gave verbal consent to proceed.  History of Present Illness   Kristine Young is a 63 y.o. female here for follow up for limited cutaneous scleroderma and osteoarthritis currently on hydroxychloroquine  400 mg daily.    She has been experiencing jaw pain primarily on the left side, extending to her ear, eye socket, and sometimes her tongue. The pain is severe at night. She was diagnosed with temporomandibular joint disorder (TMJ) and has been using Tylenol  for relief. She also reports sensitivity to bright light.  Dizziness has been present for the past four to five months, occurring when she turns over in bed or stops quickly while driving. She finds it unusual that the dizziness is associated with her jaw pain. She has a history of ear and sinus infections but has not had any recent infections.  She mentions discomfort from her glasses, which may contain metal, and has noticed white spots on her skin, which she associates with her scleroderma. These spots are described as areas with no pigment, similar to the opposite of freckles.  No recent changes in vision, although her prescription changed slightly six months ago.      Previous HPI 03/31/2024 Kristine Young is a 63 y.o. female here for follow up for limited cutaneous scleroderma and osteoarthritis currently on hydroxychloroquine  400 mg daily.    She has a history of vitamin D  deficiency with previous levels recorded at 12 ng/mL. She was prescribed a high-dose vitamin D  supplement for twelve weeks but is uncertain if she  completed the course. No recent blood tests have been conducted since November, and she prefers to have her lab work done at the [facility].   She has osteoarthritis affecting her knees, hips, and left elbow. She experiences limited rotation in her right hip and notes clicking and grinding in her left knee. Her left elbow does not fully straighten. Currently, her knees are not causing significant discomfort. Her boyfriend encourages her to exercise.   No new skin changes, rashes, color changes, swelling, or recent serious infections. She saw her primary care provider last week for blood pressure management but did not have any blood tests done at that time.       Previous HPI 08/15/2023 Kristine Young is a 63 y.o. female here for follow up for limited cutaneous scleroderma and osteoarthritis currently on hydroxychloroquine  400 mg daily.  She has overall been doing fairly well without major medical changes.  She discontinued the remaining low-dose prednisone  since her last visit.  Has noticed some increased pain and stiffness especially involving both hands and feet.  Not with any new visible swelling.  Does not see any new skin rash or discolored areas.   Previous HPI 10/23/22 Kristine Young is a 63 y.o. female here for follow up limited scleroderma and osteoarthritis currently on HCQ 400 mg daily prednisone  2.5 mg daily. Since our last visit she feels  symptoms are somewhat better that before. Not sure if this was related to work and life stressor changes at the time. Also had COVID illness last summer with high fevers and took a while to completely recover. She has noticed some worsening of knee pain with cold weather and has stiffness lasting about 30 minutes per day. No problems with raynaud's symptoms. She gets acid reflux symptoms only sometimes, takes pantoprazole  intermittently for this.   Previous HPI 05/26/22 Kristine Young is a 63 y.o. female here for follow up for limited systemic  sclerosis with cutaneous symptoms.  Since her last visit she tapered off the remaining prednisone  stopping the 2.5 mg daily dose for about a month now.  Symptoms were doing pretty well but 2 to 3 weeks after ending the steroids she started developing diffuse joint pains and stiffness that have now been severe for 1 or 2 weeks.  Her hands and shoulders are the most severely affected and worst in her left arm.  She has not seen any visible swelling or changes in the affected areas.  No new skin changes observed.     Previous HPI THERSIA Young is a 63 y.o. female here for follow up for limited systemic sclerosis with cutaneous symptoms on HCQ 400 mg daily and prednisone  5 mg daily attempted to taper steroid dose. Doing okay overall she still has itching on her leg and foot without new visible skin changes. She has noticed an area on the right side of her neck starting to bother her and itching. She had bronchitis recently recovered had a lot of cough and shortness of breath. Also developed throat pain and choking on some foods and liquids, worst was a piece of hamburger. Started on pantoprazole  for GERD for this.    Previous HPI 06/20/21 Kristine Young is a 63 y.o. female here for follow up for limited systemic sclerosis on HCQ 400 mg PO daily. She had taken prednisone  5 mg daily recommended to taper off this since our last follow up.  She decrease the prednisone  to 2.5 mg daily without any immediate problems but in the past several weeks she has felt increased pain in multiple areas particularly in the back and bilateral hips.  She has noticed some sensation of skin tightness and itching over her distal legs.    Previous HPI  10/29/20 Kristine Young is a 63 y.o. female here for scleroderma. She takes hydroxychloroquine  400mg  daily and prednisone  10mg  daily for arthritis symptoms currently.  She was initially diagnosed by Dr. Fae since about 2 years ago based on arthritis, skin changes, and high  positive anti centromere Abs. Her arthritis involves multiple joints including bilateral shoulders, hands, knees, feet, and myalgias of the thighs. She lacks typical raynaud's symptoms or scleroderma related skin thickening of the hands but does have changes with skin discoloration on her arms and legs.   She sees ophthalmology in [place] and had cataract surgery about 6 months ago. She reports up to 40 lb weight gain since this problem started. She has had an echocardiogram apparently no significant problems identified. She has had PFTs consistent with mild restrictive defect.   Review of Systems  Constitutional:  Positive for fatigue.  HENT:  Positive for mouth dryness. Negative for mouth sores.   Eyes:  Positive for dryness.  Respiratory:  Negative for shortness of breath.   Cardiovascular:  Negative for chest pain and palpitations.  Gastrointestinal:  Negative for blood in stool, constipation and diarrhea.  Endocrine:  Positive for increased urination.  Genitourinary:  Positive for involuntary urination.  Musculoskeletal:  Positive for joint pain, gait problem, joint pain and morning stiffness. Negative for joint swelling, myalgias, muscle weakness, muscle tenderness and myalgias.  Skin:  Negative for color change, rash, hair loss and sensitivity to sunlight.  Allergic/Immunologic: Negative for susceptible to infections.  Neurological:  Positive for dizziness and headaches.  Hematological:  Negative for swollen glands.  Psychiatric/Behavioral:  Positive for depressed mood and sleep disturbance. The patient is nervous/anxious.     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   Osteoarthritis of both knees 10/23/2022    GERD (gastroesophageal reflux disease) 10/23/2022   Low back pain 06/20/2021   Proteinuria 05/03/2021   Limited systemic sclerosis (HCC) 10/29/2020   Chronic arthralgias of knees and hips 10/29/2020   Peripheral neuropathy 10/29/2020    Past Medical History:  Diagnosis Date   Anxiety    Arthritis    Cataract    Depression    Fibroid    Fibromyalgia    Hematoma    left buttocks due to fall/ from 2 years ago (2018)   Hypercholesterolemia    Hypertension    Neuropathy    Scleroderma (HCC)    Sleep apnea    TMJ (dislocation of temporomandibular joint) 08/31/2024    Family History  Adopted: Yes  Problem Relation Age of Onset   Colon cancer Father 109   Alzheimer's disease Father    Rheum arthritis Sister    Thyroid disease Sister    Diabetes Sister    Hypertension Sister    Macular degeneration Sister    Scleroderma Sister    Sjogren's syndrome Sister    Liver disease Neg Hx    Pancreatic disease Neg Hx    Stomach cancer Neg Hx    Esophageal cancer Neg Hx    Past Surgical History:  Procedure Laterality Date   ABDOMINAL SURGERY  1997   CATARACT EXTRACTION W/PHACO Right 03/22/2020   Procedure: CATARACT EXTRACTION PHACO AND INTRAOCULAR LENS PLACEMENT (IOC);  Surgeon: Harrie Agent, MD;  Location: [facility];  Service: Ophthalmology;  Laterality: Right;  CDE: 5.94   CATARACT EXTRACTION W/PHACO Left 04/05/2020   Procedure: CATARACT EXTRACTION PHACO AND INTRAOCULAR LENS PLACEMENT LEFT EYE;  Surgeon: Harrie Agent, MD;  Location: [facility];  Service: Ophthalmology;  Laterality: Left;  CDE: 5.79   CHOLECYSTECTOMY N/A 07/20/2014   Procedure: LAPAROSCOPIC CHOLECYSTECTOMY;  Surgeon: Oneil DELENA Budge, MD;  Location: [facility];  Service: General;  Laterality: N/A;   EYE SURGERY Left    45 yrs ago- eye went out of focus. x2   MYOMECTOMY     Women's- 15 yrs ago   Social History   Social History Narrative   Not on file   Immunization History  Administered Date(s) Administered    Moderna Sars-Covid-2 Vaccination 12/09/2019, 01/06/2020   Tdap 10/20/2016     Objective: Vital Signs: BP 136/70   Pulse 75   Temp 97.7 F (36.5 C)   Resp 16   Ht 5' 2.5 (1.588 m)   Wt 200 lb (90.7 kg)   BMI 36.00 kg/m    Physical Exam HENT:     Mouth/Throat:     Comments: Sinus tenderness on the left side, jaw  pain on opening, and facial tenderness on palpation.  Eyes:     Conjunctiva/sclera: Conjunctivae normal.  Cardiovascular:     Rate and Rhythm: Normal rate and regular rhythm.  Pulmonary:     Effort: Pulmonary effort is normal.     Breath sounds: Normal breath sounds.  Lymphadenopathy:     Cervical: No cervical adenopathy.  Skin:    General: Skin is warm and dry.  Neurological:     Mental Status: She is alert.  Psychiatric:        Mood and Affect: Mood normal.      Musculoskeletal Exam:  Left elbow extension ROM, no palpable swelling Wrists full ROM no tenderness or swelling Fingers full ROM no tenderness or swelling Knees full ROM, L>R knee patellofemoral crepitus Ankles full ROM no tenderness or swelling   Investigation: No additional findings.  Imaging: No results found.  Recent Labs: Lab Results  Component Value Date   WBC 6.8 08/15/2023   HGB 12.6 08/15/2023   PLT 249 08/15/2023   NA 141 08/15/2023   K 3.8 08/15/2023   CL 103 08/15/2023   CO2 28 08/15/2023   GLUCOSE 93 08/15/2023   BUN 14 08/15/2023   CREATININE 0.75 08/15/2023   BILITOT 0.3 08/15/2023   ALKPHOS 98 07/17/2014   AST 18 08/15/2023   ALT 19 08/15/2023   PROT 6.5 08/15/2023   ALBUMIN 4.2 07/17/2014   CALCIUM  9.3 08/15/2023   GFRAA 94 01/28/2021    Speciality Comments: PLQ Eye Exam 03/26/2023 normal [facility] Dr. f/u 12 months  Procedures:  No procedures performed Allergies: Hydrocodone, Codeine, and Tetracyclines & related   Assessment / Plan:     Visit Diagnoses: Limited systemic sclerosis (HCC) - Plan: hydroxychloroquine  (PLAQUENIL ) 200 MG tablet Skin disease  appears to be doing very well main complaints are more joint related at this time.  Has followed up with Dr. Barbra for dermatology no major intervention required. Depigmented spots likely related to scleroderma, sun damage, or scratching. Not indicative of vitiligo. - Continue hydroxychloroquine  400 mg daily - Reassured that depigmented spots are not indicative of vitiligo and are not expected to spread.  Peripheral polyneuropathy - Cymbalta 60 mg twice daily  Left facial pain - Plan: Sedimentation rate, C-reactive protein TMJ pain with dizziness, ear pain, and light sensitivity. Differential includes inner ear issues, sinus problems, or inflammation. Dizziness suggests possible inner ear involvement. - Ordered blood test for inflammation markers. - Referred to ENT for ear and sinus evaluation. - Consider short course of steroids and antibiotics if inflammation present. - Discussed potential use of Flexeril for TMJ pain management.   Peripheral polyneuropathy - Cymbalta 60 mg twice daily   Vitamin D  deficiency - Plan: VITAMIN D  25 Hydroxy (Vit-D Deficiency, Fractures) Previous Vitamin D  level at 12 indicates deficiency, increasing arthritis and osteoporosis risk. Discussed prevalence in African American women and benefits of supplementation for joint and immune health. - Recheck vitamin D  level to assess need for high-dose supplementation.   Primary osteoarthritis of both knees Osteoarthritis Osteoarthritis affects multiple joints with degenerative changes. Discussed lack of regenerative treatments and emphasized lifestyle modifications for symptom relief. Explained benefits of weight loss, muscle strengthening, and smoking cessation. - Recommend exercises: straight leg raises, wall sits, assisted squats.  Orders: Orders Placed This Encounter  Procedures   Sedimentation rate   C-reactive protein   VITAMIN D  25 Hydroxy (Vit-D Deficiency, Fractures)   Meds ordered this encounter   Medications   hydroxychloroquine  (PLAQUENIL ) 200 MG tablet  Sig: Take 1 tablet (200 mg total) by mouth 2 (two) times daily.    Dispense:  180 tablet    Refill:  1     Follow-Up Instructions: Return in about 6 months (around 03/31/2025) for LtdSS[facility] on HCQ f/u 6mos.   Lonni LELON Ester, MD  Note - This record has been created using [employer].  Chart creation errors have been sought, but may not always  have been located. Such creation errors do not reflect on  the standard of medical care. "

## 2024-09-30 ENCOUNTER — Ambulatory Visit: Attending: Internal Medicine | Admitting: Internal Medicine

## 2024-09-30 ENCOUNTER — Encounter: Payer: Self-pay | Admitting: Internal Medicine

## 2024-09-30 VITALS — BP 136/70 | HR 75 | Temp 97.7°F | Resp 16 | Ht 62.5 in | Wt 200.0 lb

## 2024-09-30 DIAGNOSIS — M349 Systemic sclerosis, unspecified: Secondary | ICD-10-CM | POA: Diagnosis not present

## 2024-09-30 DIAGNOSIS — R519 Headache, unspecified: Secondary | ICD-10-CM | POA: Diagnosis not present

## 2024-09-30 DIAGNOSIS — G629 Polyneuropathy, unspecified: Secondary | ICD-10-CM

## 2024-09-30 DIAGNOSIS — Z79899 Other long term (current) drug therapy: Secondary | ICD-10-CM | POA: Diagnosis not present

## 2024-09-30 DIAGNOSIS — M170 Bilateral primary osteoarthritis of knee: Secondary | ICD-10-CM

## 2024-09-30 DIAGNOSIS — E559 Vitamin D deficiency, unspecified: Secondary | ICD-10-CM

## 2024-09-30 MED ORDER — HYDROXYCHLOROQUINE SULFATE 200 MG PO TABS: 200.0000 mg | ORAL_TABLET | Freq: Two times a day (BID) | ORAL | 1 refills | Status: AC

## 2024-10-01 LAB — VITAMIN D 25 HYDROXY (VIT D DEFICIENCY, FRACTURES): Vit D, 25-Hydroxy: 24 ng/mL — ABNORMAL LOW (ref 30–100)

## 2024-10-01 LAB — C-REACTIVE PROTEIN: CRP: <3 mg/L

## 2024-10-01 LAB — SEDIMENTATION RATE: Sed Rate: 11 mm/h (ref 0–30)

## 2024-10-15 ENCOUNTER — Telehealth: Payer: Self-pay

## 2024-10-15 NOTE — Telephone Encounter (Signed)
Patient called regarding lab results. Please review and advise

## 2024-10-16 ENCOUNTER — Ambulatory Visit: Payer: Self-pay | Admitting: Internal Medicine

## 2024-10-16 NOTE — Progress Notes (Signed)
 Sedimentation rate and CRP are normal which is great no medication change needed for inflammation. Vitamin D  level remains mildly low at 24.  I do not think she needs to go back on a high dose prescription strength vitamin D  supplement.  I recommend her to start with an over-the-counter vitamin D  supplement of 2000 units daily, or if she is already on a vitamin D  supplement to increase the dose by 1000 to 2000 units daily.

## 2024-10-16 NOTE — Telephone Encounter (Signed)
 Now addressed in associated result note

## 2025-04-01 ENCOUNTER — Ambulatory Visit: Admitting: Internal Medicine
